# Patient Record
Sex: Male | Born: 1985 | Hispanic: Refuse to answer
Health system: Southern US, Community
[De-identification: ages and names within clinical notes are randomized; demographics above are authoritative.]

## PROBLEM LIST (undated history)

## (undated) DIAGNOSIS — J45909 Unspecified asthma, uncomplicated: Secondary | ICD-10-CM

## (undated) DIAGNOSIS — I10 Essential (primary) hypertension: Secondary | ICD-10-CM

## (undated) DIAGNOSIS — Z789 Other specified health status: Secondary | ICD-10-CM

## (undated) HISTORY — PX: HERNIA REPAIR: SHX51

---
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
## 2006-05-19 ENCOUNTER — Emergency Department (HOSPITAL_COMMUNITY): Admission: EM | Admit: 2006-05-19 | Discharge: 2006-05-20 | Payer: Self-pay | Admitting: Emergency Medicine

## 2006-06-13 ENCOUNTER — Emergency Department (HOSPITAL_COMMUNITY): Admission: EM | Admit: 2006-06-13 | Discharge: 2006-06-13 | Payer: Self-pay | Admitting: Emergency Medicine

## 2007-03-17 ENCOUNTER — Emergency Department (HOSPITAL_COMMUNITY): Admission: EM | Admit: 2007-03-17 | Discharge: 2007-03-17 | Payer: Self-pay | Admitting: Emergency Medicine

## 2007-06-06 ENCOUNTER — Emergency Department (HOSPITAL_COMMUNITY): Admission: EM | Admit: 2007-06-06 | Discharge: 2007-06-07 | Payer: Self-pay | Admitting: Emergency Medicine

## 2008-05-10 ENCOUNTER — Emergency Department (HOSPITAL_COMMUNITY): Admission: EM | Admit: 2008-05-10 | Discharge: 2008-05-10 | Payer: Self-pay | Admitting: Emergency Medicine

## 2008-06-22 ENCOUNTER — Emergency Department (HOSPITAL_COMMUNITY): Admission: EM | Admit: 2008-06-22 | Discharge: 2008-06-22 | Payer: Self-pay | Admitting: Emergency Medicine

## 2012-07-04 ENCOUNTER — Emergency Department (HOSPITAL_COMMUNITY)
Admission: EM | Admit: 2012-07-04 | Discharge: 2012-07-04 | Disposition: A | Discharge status: Home / Self Care | Payer: Self-pay | Attending: Emergency Medicine | Admitting: Emergency Medicine

## 2012-07-04 ENCOUNTER — Encounter (HOSPITAL_COMMUNITY): Payer: Self-pay

## 2012-07-04 DIAGNOSIS — K219 Gastro-esophageal reflux disease without esophagitis: Secondary | ICD-10-CM | POA: Insufficient documentation

## 2012-07-04 DIAGNOSIS — R1110 Vomiting, unspecified: Secondary | ICD-10-CM | POA: Insufficient documentation

## 2012-07-04 DIAGNOSIS — F17200 Nicotine dependence, unspecified, uncomplicated: Secondary | ICD-10-CM | POA: Insufficient documentation

## 2012-07-04 LAB — CBC WITH DIFFERENTIAL/PLATELET
Band Neutrophils: 0 % (ref 0–10)
Basophils Absolute: 0 10*3/uL (ref 0.0–0.1)
Basophils Relative: 0 % (ref 0–1)
HCT: 41.8 % (ref 39.0–52.0)
Hemoglobin: 14.4 g/dL (ref 13.0–17.0)
MCH: 30.3 pg (ref 26.0–34.0)
MCHC: 34.4 g/dL (ref 30.0–36.0)
Metamyelocytes Relative: 0 %
Myelocytes: 0 %
Promyelocytes Absolute: 0 %

## 2012-07-04 LAB — COMPREHENSIVE METABOLIC PANEL
ALT: 29 U/L (ref 0–53)
AST: 18 U/L (ref 0–37)
CO2: 26 mEq/L (ref 19–32)
Calcium: 9 mg/dL (ref 8.4–10.5)
Chloride: 105 mEq/L (ref 96–112)
GFR calc non Af Amer: >90 mL/min (ref >90)
Sodium: 139 mEq/L (ref 135–145)
Total Bilirubin: 0.3 mg/dL (ref 0.3–1.2)

## 2012-07-04 LAB — URINALYSIS, ROUTINE W REFLEX MICROSCOPIC
Bilirubin Urine: NEGATIVE
Glucose, UA: NEGATIVE mg/dL
Ketones, ur: NEGATIVE mg/dL
pH: 6 (ref 5.0–8.0)

## 2012-07-04 MED ORDER — PANTOPRAZOLE SODIUM 40 MG PO TBEC
40.0000 mg | DELAYED_RELEASE_TABLET | Freq: Once | ORAL | Status: AC
  Administered 2012-07-04: 40 mg via ORAL
  Filled 2012-07-04: qty 1

## 2012-07-04 MED ORDER — GI COCKTAIL ~~LOC~~
30.0000 mL | Freq: Once | ORAL | Status: AC
  Administered 2012-07-04: 30 mL via ORAL
  Filled 2012-07-04: qty 30

## 2012-07-04 MED ORDER — ALBUTEROL SULFATE HFA 108 (90 BASE) MCG/ACT IN AERS: 1.0000 | INHALATION_SPRAY | Freq: Four times a day (QID) | RESPIRATORY_TRACT | Status: DC | PRN

## 2012-07-04 MED ORDER — PANTOPRAZOLE SODIUM 20 MG PO TBEC: 20.0000 mg | DELAYED_RELEASE_TABLET | Freq: Every day | ORAL | Status: DC

## 2012-07-04 NOTE — ED Provider Notes (Signed)
History     CSN: [id number]  Arrival date & time 07/04/12  7829   First MD Initiated Contact with Patient 07/04/12 1019      Chief Complaint  Patient presents with  . Chest Pain  . Hematemesis    (Consider location/radiation/quality/duration/timing/severity/associated sxs/prior treatment) HPI Comments: Pt presents to the ED for epigastric abdominal pain and vomiting after a night of drinking with friends.  States he drank several beers and a few liquor drinks last night.  Upon waking this morning, he was having a burning sensation in his epigastrium radiating into his throat with intermittent sharp pains.  States he had one episode of vomiting this morning with what he thinks was blood in it.  Symptoms have now resolved and pt is able to tolerate PO fluids without difficulty.  Pt has never had a formal dx of GERD but suspects that he has it- triggers appears to be spicy foods, acidic fruits and vegetables, and EtOH.  Pt usually drinks on weekends, socially with friends and usually not in excess.  Pt expresses concern for liver damage given events of this am.  Denies any SOB, palpitations, nausea, diarrhea, hematochezia, hematuria, dysuria, or increased urinary frequency.    The history is provided by the patient.    History reviewed. No pertinent past medical history.  History reviewed. No pertinent past surgical history.  No family history on file.  History  Substance Use Topics  . Smoking status: Current Every Day Smoker -- 0.50 packs/day  . Smokeless tobacco: Not on file  . Alcohol Use: Yes     Comment: Regularly       Review of Systems  Gastrointestinal: Positive for nausea and vomiting.       Reflux  All other systems reviewed and are negative.    Allergies  Review of patient's allergies indicates no known allergies.  Home Medications  No current outpatient prescriptions on file.  BP 126/82  Pulse 91  Temp(Src) 97.7 F (36.5 C) (Oral)  Resp 16  Ht 5\' 11"   (1.803 m)  Wt 225 lb (102.059 kg)  BMI 31.39 kg/m2  SpO2 97%  Physical Exam  Nursing note and vitals reviewed. Constitutional: He is oriented to person, place, and time. He appears well-developed and well-nourished.  HENT:  Head: Normocephalic and atraumatic.  Mouth/Throat: Uvula is midline, oropharynx is clear and moist and mucous membranes are normal.  No evidence of blood in oropharynx  Eyes: Conjunctivae and EOM are normal. Pupils are equal, round, and reactive to light.  Neck: Normal range of motion.  Cardiovascular: Normal rate, regular rhythm and normal heart sounds.   Pulmonary/Chest: Effort normal and breath sounds normal.  Abdominal: Soft. Bowel sounds are normal. There is no tenderness. There is no guarding.  Burning sensation epigastrically radiating to sternum  Musculoskeletal: Normal range of motion.  Neurological: He is alert and oriented to person, place, and time.  Skin: Skin is warm and dry.  Psychiatric: He has a normal mood and affect.    ED Course  Procedures (including critical care time)   Date: 07/04/2012  Rate: 89  Rhythm: normal sinus rhythm  QRS Axis: normal  Intervals: normal  ST/T Wave abnormalities: normal  Conduction Disutrbances:none  Narrative Interpretation: normal EKG, no STEMI  Old EKG Reviewed: none available    Labs Reviewed  URINALYSIS, ROUTINE W REFLEX MICROSCOPIC - Abnormal; Notable for the following:    Specific Gravity, Urine 1.033 (*)    All other components within normal limits  CBC  WITH DIFFERENTIAL  COMPREHENSIVE METABOLIC PANEL  BILIRUBIN, DIRECT   No results found.   1. Vomiting   2. Acid reflux       MDM    27 year old male presenting to the ED for sternal chest pain and vomiting after a night of drinking with friends. Patient is concerned that he threw up blood this morning and is concerned for possible liver damage.  Labs largely WNL.  LFT's normal and not consistent with liver disease.  Good relief of sx  with protonix and GI cocktail.  No further vomiting in the ED, low suspicion of esophageal tear or rupture.  Sx not consistent with acute pancreatitis, suspect that sx are GERD related. Instructed to avoid trigger foods and use EtOH in moderation.  Rx protonix.  Referred to [facility] to address other concerns.  Discussed plan with pt- he agreed.  Return precautions advised.     Garlon Hatchet, PA-C 07/06/12 1548  Garlon Hatchet, PA-C 07/06/12 [phone]

## 2012-07-04 NOTE — ED Notes (Signed)
Pt states he woke up around 5am this morning with centralized sharp, burning chest pain. Pain did not radiate. Pain did not last long. States he walked to the bus stop and vomited. Wife states pt vomited at home this morning as well. Pain subsided but patient wanted to get evaluated. Denies any pain or nausea at this time.

## 2012-07-04 NOTE — ED Notes (Addendum)
Pt presents with c/o chest pain that began this morning.  Pt says the pain was in the center of his chest and was a burning pain. Pt is a smoker. Pt also had one episode of vomiting blood this morning. No chest pain at present, has resolved since this morning.

## 2012-07-04 NOTE — Progress Notes (Signed)
[facility] CL gave patient an OC application. °

## 2012-07-19 NOTE — ED Provider Notes (Signed)
History/physical exam/procedure(s) were performed by non-physician practitioner and as supervising physician I was immediately available for consultation/collaboration. I have reviewed all notes and am in agreement with care and plan.   Hilario Quarry, MD 07/19/12 [phone]

## 2012-07-20 NOTE — ED Provider Notes (Signed)
History/physical exam/procedure(s) were performed by non-physician practitioner and as supervising physician I was immediately available for consultation/collaboration. I have reviewed all notes and am in agreement with care and plan.   Hilario Quarry, MD 07/20/12 [phone]

## 2015-08-17 ENCOUNTER — Emergency Department (HOSPITAL_COMMUNITY): Payer: Self-pay

## 2015-08-17 ENCOUNTER — Encounter (HOSPITAL_COMMUNITY): Payer: Self-pay | Admitting: Emergency Medicine

## 2015-08-17 ENCOUNTER — Emergency Department (HOSPITAL_COMMUNITY)
Admission: EM | Admit: 2015-08-17 | Discharge: 2015-08-17 | Disposition: A | Discharge status: Home / Self Care | Payer: Self-pay | Attending: Emergency Medicine | Admitting: Emergency Medicine

## 2015-08-17 DIAGNOSIS — F17210 Nicotine dependence, cigarettes, uncomplicated: Secondary | ICD-10-CM | POA: Insufficient documentation

## 2015-08-17 DIAGNOSIS — Y9367 Activity, basketball: Secondary | ICD-10-CM | POA: Insufficient documentation

## 2015-08-17 DIAGNOSIS — Y999 Unspecified external cause status: Secondary | ICD-10-CM | POA: Insufficient documentation

## 2015-08-17 DIAGNOSIS — R000 Tachycardia, unspecified: Secondary | ICD-10-CM | POA: Insufficient documentation

## 2015-08-17 DIAGNOSIS — S82891A Other fracture of right lower leg, initial encounter for closed fracture: Secondary | ICD-10-CM

## 2015-08-17 DIAGNOSIS — W1849XA Other slipping, tripping and stumbling without falling, initial encounter: Secondary | ICD-10-CM | POA: Insufficient documentation

## 2015-08-17 DIAGNOSIS — S82899A Other fracture of unspecified lower leg, initial encounter for closed fracture: Secondary | ICD-10-CM

## 2015-08-17 DIAGNOSIS — T148XXA Other injury of unspecified body region, initial encounter: Secondary | ICD-10-CM

## 2015-08-17 DIAGNOSIS — Y929 Unspecified place or not applicable: Secondary | ICD-10-CM | POA: Insufficient documentation

## 2015-08-17 DIAGNOSIS — S82831A Other fracture of upper and lower end of right fibula, initial encounter for closed fracture: Secondary | ICD-10-CM | POA: Insufficient documentation

## 2015-08-17 DIAGNOSIS — S9304XA Dislocation of right ankle joint, initial encounter: Secondary | ICD-10-CM | POA: Insufficient documentation

## 2015-08-17 MED ORDER — OXYCODONE-ACETAMINOPHEN 5-325 MG PO TABS
2.0000 | ORAL_TABLET | Freq: Once | ORAL | Status: AC
  Administered 2015-08-17: 2 via ORAL
  Filled 2015-08-17: qty 2

## 2015-08-17 MED ORDER — FENTANYL CITRATE (PF) 100 MCG/2ML IJ SOLN
100.0000 ug | Freq: Once | INTRAMUSCULAR | Status: AC
  Administered 2015-08-17: 100 ug via INTRAVENOUS
  Filled 2015-08-17: qty 2

## 2015-08-17 MED ORDER — HYDROCODONE-ACETAMINOPHEN 5-325 MG PO TABS: 2.0000 | ORAL_TABLET | ORAL | Status: DC | PRN

## 2015-08-17 NOTE — Consult Note (Signed)
Patient ID: George Rollins MRN: [id number] DOB/AGE: 1985/09/09 30 y.o.  Admit date: 08/17/2015  Admission Diagnoses:  Fibula Fracture Subluxation of the Talus Avulsion fragment   HPI: Pleasant 30 year old male pt presents to the ED this evening after severely twisting his ankle playing basketball.  He reports severe pain and he is unable to bear weight.   Past Medical History: History reviewed. No pertinent past medical history.  Surgical History: History reviewed. No pertinent past surgical history.  Family History: History reviewed. No pertinent family history.  Social History: Social History   Social History  . Marital Status: Single    Spouse Name: N/A  . Number of Children: N/A  . Years of Education: N/A   Occupational History  . Not on file.   Social History Main Topics  . Smoking status: Current Every Day Smoker -- 0.50 packs/day  . Smokeless tobacco: Not on file  . Alcohol Use: Yes     Comment: Regularly   . Drug Use: No  . Sexual Activity: Not on file   Other Topics Concern  . Not on file   Social History Narrative    Allergies: Review of patient's allergies indicates no known allergies.  Medications: I have reviewed the patient's current medications.  Vital Signs: Patient Vitals for the past 24 hrs:  BP Temp Temp src Pulse Resp SpO2  08/17/15 2045 120/87 mmHg - - 87 - 95 %  08/17/15 2030 122/87 mmHg - - 89 - 95 %  08/17/15 2015 117/77 mmHg - - 94 - 94 %  08/17/15 2000 122/86 mmHg - - 91 - 98 %  08/17/15 1830 127/79 mmHg - - 98 - 93 %  08/17/15 1815 131/90 mmHg - - 103 - 98 %  08/17/15 1757 135/88 mmHg 99.2 F (37.3 C) Oral 109 16 99 %    Radiology: Dg Ankle Complete Right  08/17/2015  CLINICAL DATA:  Pt reports right ankle injury while playing basketball today; ankle has already been reduced due to an obvious deformity per patient; no prior history of injury or surgery EXAM: RIGHT ANKLE - COMPLETE 3+ VIEW COMPARISON:  Foot film  06/22/2008 FINDINGS: There is an oblique fracture through the distal fibula above the lateral malleolus. There is lateral subluxation of the talus in relation to the tibia (widening of the medial ankle mortise). There is a fragment within the medial ankle mortise concerning for a medial malleolar avulsion fracture. IMPRESSION: 1. Oblique fracture of the distal fibula. 2. Lateral subluxation of the talus with widening of the medial ankle mortise. 3. Avulsion fragment within the widened medial mortise likely donated from the medial malleolus. Electronically Signed   By: Genevive BiStewart  Edmunds M.D.   On: 08/17/2015 19:10    Labs: No results for input(s): WBC, RBC, HCT, PLT in the last 72 hours. No results for input(s): NA, K, CL, CO2, BUN, CREATININE, GLUCOSE, CALCIUM in the last 72 hours. No results for input(s): LABPT, INR in the last 72 hours.  Review of Systems: ROS  Physical Exam: Neurologically intact ABD soft Sensation intact distally Compartment soft Edema and deformity note Able to move toes  Assessment and Plan: Pt reduced in ED Post reduction xray reviewed Pt will go home and present to clinic this week to discuss surgical intervention Pain medication will be provided by the ED Dr.  Venita Lickahari Riva Sesma, MD [facility] [phone]  Post reduction xrays satisfactory Widening of the medial clear space  Minimally displaced lateral malleolar fracture May require  ORIF as out patient  Will discuss with Dr Victorino DikeHewitt and arrange definitive fracture management Ok for d/c from ER if able to crutch walk (strict NWB R LE) and pain controlled Will keep elevated and iced to reduce swelling

## 2015-08-17 NOTE — ED Notes (Signed)
[facility] from scene. Right ankle obvious deformity. Pulses and sensation intact distal to injury. fentanyl PTA

## 2015-08-17 NOTE — Discharge Instructions (Signed)
Call office in the morning to arrange follow up with Dr Victorino DikeHewitt [phone]

## 2015-08-17 NOTE — ED Provider Notes (Signed)
CSN: [id number]     Arrival date & time 08/17/15  1753 History   First MD Initiated Contact with Patient 08/17/15 1755     Chief Complaint  Patient presents with  . Leg Injury     (Consider location/radiation/quality/duration/timing/severity/associated sxs/prior Treatment) HPI  The patient is a 30 year old male, he states that he was playing basketball just prior to arrival when he came down from a jump, slipped in the mud and felt acute onset of pain in his right ankle. He was found to have a deformity of his right ankle and was immobilized in a soft splint by EMS. The patient denies any other injuries and has no numbness. Symptoms are persistent, acute in onset, worse with palpation, not associated with head injury  History reviewed. No pertinent past medical history. History reviewed. No pertinent past surgical history. History reviewed. No pertinent family history. Social History  Substance Use Topics  . Smoking status: Current Every Day Smoker -- 0.50 packs/day  . Smokeless tobacco: None  . Alcohol Use: Yes     Comment: Regularly     Review of Systems  All other systems reviewed and are negative.     Allergies  Review of patient's allergies indicates no known allergies.  Home Medications   Prior to Admission medications   Medication Sig Start Date End Date Taking? Authorizing Provider  albuterol (PROVENTIL HFA;VENTOLIN HFA) 108 (90 BASE) MCG/ACT inhaler Inhale 1-2 puffs into the lungs every 6 (six) hours as needed for wheezing. 07/04/12  Yes Garlon HatchetLisa M Sanders, PA-C  albuterol (PROVENTIL HFA;VENTOLIN HFA) 108 (90 BASE) MCG/ACT inhaler Inhale 2 puffs into the lungs every 6 (six) hours as needed for wheezing.    Historical Provider, MD  HYDROcodone-acetaminophen (NORCO/VICODIN) 5-325 MG tablet Take 2 tablets by mouth every 4 (four) hours as needed. 08/17/15   Eber HongBrian Anastasia Tompson, MD  loratadine-pseudoephedrine (CLARITIN-D 24-HOUR) 10-240 MG per 24 hr tablet Take 1 tablet by mouth daily  as needed for allergies.    Historical Provider, MD  pantoprazole (PROTONIX) 20 MG tablet Take 1 tablet (20 mg total) by mouth daily. 07/04/12   Garlon HatchetLisa M Sanders, PA-C   BP 120/87 mmHg  Pulse 87  Temp(Src) 99.2 F (37.3 C) (Oral)  Resp 16  SpO2 95% Physical Exam  Constitutional: He appears well-developed and well-nourished. No distress.  HENT:  Head: Normocephalic and atraumatic.  Mouth/Throat: Oropharynx is clear and moist. No oropharyngeal exudate.  Eyes: Conjunctivae and EOM are normal. Pupils are equal, round, and reactive to light. Right eye exhibits no discharge. Left eye exhibits no discharge. No scleral icterus.  Neck: Normal range of motion. Neck supple. No JVD present. No thyromegaly present.  Cardiovascular: Regular rhythm, normal heart sounds and intact distal pulses.  Exam reveals no gallop and no friction rub.   No murmur heard. Mild tachycardia. Normal pulses at the right foot  Pulmonary/Chest: Effort normal and breath sounds normal. No respiratory distress. He has no wheezes. He has no rales.  Abdominal: Soft. Bowel sounds are normal. He exhibits no distension and no mass. There is no tenderness.  Musculoskeletal: He exhibits tenderness. He exhibits no edema.  Tenderness and deformity to the right ankle, the foot appears to be laterally deviated  Lymphadenopathy:    He has no cervical adenopathy.  Neurological: He is alert. Coordination normal.  Normal sensation to the right foot  Skin: Skin is warm and dry. No rash noted. No erythema.  Psychiatric: He has a normal mood and affect. His behavior is normal.  Nursing note and vitals reviewed.   ED Course  Reduction of dislocation Date/Time: 08/17/2015 6:10 PM Performed by: Eber HongMILLER, Sierra Bissonette Authorized by: Eber HongMILLER, Lagretta Loseke Consent: Verbal consent obtained. Risks and benefits: risks, benefits and alternatives were discussed Consent given by: patient Patient understanding: patient states understanding of the procedure being  performed Required items: required blood products, implants, devices, and special equipment available Patient identity confirmed: verbally with patient Time out: Immediately prior to procedure a "time out" was called to verify the correct patient, procedure, equipment, support staff and site/side marked as required. Local anesthesia used: no Patient sedated: no Patient tolerance: Patient tolerated the procedure well with no immediate complications Comments: Postreduction the patient had normal pulses sensation but decreased range of motion secondary to pain and mild swelling. He has crepitance over the right lateral ankle, no tenderness over the medial malleolus   (including critical care time) Labs Review Labs Reviewed - No data to display  Imaging Review Dg Ankle Complete Right  08/17/2015  CLINICAL DATA:  Pt reports right ankle injury while playing basketball today; ankle has already been reduced due to an obvious deformity per patient; no prior history of injury or surgery EXAM: RIGHT ANKLE - COMPLETE 3+ VIEW COMPARISON:  Foot film 06/22/2008 FINDINGS: There is an oblique fracture through the distal fibula above the lateral malleolus. There is lateral subluxation of the talus in relation to the tibia (widening of the medial ankle mortise). There is a fragment within the medial ankle mortise concerning for a medial malleolar avulsion fracture. IMPRESSION: 1. Oblique fracture of the distal fibula. 2. Lateral subluxation of the talus with widening of the medial ankle mortise. 3. Avulsion fragment within the widened medial mortise likely donated from the medial malleolus. Electronically Signed   By: Genevive BiStewart  Edmunds M.D.   On: 08/17/2015 19:10   I have personally reviewed and evaluated these images and lab results as part of my medical decision-making.  D/w Dr. Shon BatonBrooks - in agreement with d/c - he has seen the pt - imaging obtained post reduction - splint placed and my eval shows normal n/v status  after reduction.  MDM   Final diagnoses:  Fracture, ankle, right, closed, initial encounter    Clinically the patient has a dislocated ankle, I suspect there is some level of fracture. On manipulation of the ankle with patient did not have much pain so I reduced the fracture immediately before sending the patient x-ray. He tolerated this very well. He will be given fentanyl and an x-ray. He will need immobilization and orthopedic follow-up. The ankle appears to be stable in the ankle mortise after reduction.  Meds given in ED:  Medications  fentaNYL (SUBLIMAZE) injection 100 mcg (100 mcg Intravenous Given 08/17/15 1808)  oxyCODONE-acetaminophen (PERCOCET/ROXICET) 5-325 MG per tablet 2 tablet (2 tablets Oral Given 08/17/15 1948)    New Prescriptions   HYDROCODONE-ACETAMINOPHEN (NORCO/VICODIN) 5-325 MG TABLET    Take 2 tablets by mouth every 4 (four) hours as needed.      Eber HongBrian Royale Lennartz, MD 08/17/15 2115

## 2015-08-17 NOTE — ED Notes (Signed)
Reduced at bedside by Hyacinth MeekerMiller MD

## 2015-08-17 NOTE — Progress Notes (Signed)
Orthopedic Tech Progress Note Patient Details:  George NeighborsOlijawon Rollins 04-20-1985 [id number]  Ortho Devices Type of Ortho Device: Ace wrap, Post (short leg) splint, Stirrup splint Ortho Device/Splint Location: RLE Ortho Device/Splint Interventions: Ordered, Application   Jennye MoccasinHughes, Orlin Kann Craig 08/17/2015, 7:55 PM

## 2015-08-18 ENCOUNTER — Other Ambulatory Visit: Payer: Self-pay | Admitting: Orthopedic Surgery

## 2015-08-20 ENCOUNTER — Encounter (HOSPITAL_BASED_OUTPATIENT_CLINIC_OR_DEPARTMENT_OTHER): Payer: Self-pay | Admitting: *Deleted

## 2015-08-28 ENCOUNTER — Ambulatory Visit (HOSPITAL_BASED_OUTPATIENT_CLINIC_OR_DEPARTMENT_OTHER)
Admission: RE | Admit: 2015-08-28 | Discharge: 2015-08-28 | Disposition: A | Discharge status: Home / Self Care | Payer: Self-pay | Source: Ambulatory Visit | Attending: Orthopedic Surgery | Admitting: Orthopedic Surgery

## 2015-08-28 ENCOUNTER — Ambulatory Visit (HOSPITAL_BASED_OUTPATIENT_CLINIC_OR_DEPARTMENT_OTHER): Payer: Self-pay | Admitting: Certified Registered"

## 2015-08-28 ENCOUNTER — Encounter (HOSPITAL_BASED_OUTPATIENT_CLINIC_OR_DEPARTMENT_OTHER): Payer: Self-pay | Admitting: *Deleted

## 2015-08-28 ENCOUNTER — Encounter (HOSPITAL_BASED_OUTPATIENT_CLINIC_OR_DEPARTMENT_OTHER)
Admission: RE | Disposition: A | Discharge status: Home / Self Care | Payer: Self-pay | Source: Ambulatory Visit | Attending: Orthopedic Surgery

## 2015-08-28 DIAGNOSIS — Z87891 Personal history of nicotine dependence: Secondary | ICD-10-CM | POA: Insufficient documentation

## 2015-08-28 DIAGNOSIS — Z79899 Other long term (current) drug therapy: Secondary | ICD-10-CM | POA: Insufficient documentation

## 2015-08-28 DIAGNOSIS — S82851A Displaced trimalleolar fracture of right lower leg, initial encounter for closed fracture: Secondary | ICD-10-CM | POA: Insufficient documentation

## 2015-08-28 DIAGNOSIS — X58XXXA Exposure to other specified factors, initial encounter: Secondary | ICD-10-CM | POA: Insufficient documentation

## 2015-08-28 HISTORY — DX: Other specified health status: Z78.9

## 2015-08-28 HISTORY — PX: ORIF ANKLE FRACTURE: SHX5408

## 2015-08-28 SURGERY — OPEN REDUCTION INTERNAL FIXATION (ORIF) ANKLE FRACTURE
Anesthesia: General | Site: Ankle | Laterality: Right

## 2015-08-28 MED ORDER — SODIUM CHLORIDE 0.9 % IV SOLN: INTRAVENOUS | Status: DC

## 2015-08-28 MED ORDER — PROMETHAZINE HCL 25 MG/ML IJ SOLN: 6.2500 mg | INTRAMUSCULAR | Status: DC | PRN

## 2015-08-28 MED ORDER — ONDANSETRON HCL 4 MG/2ML IJ SOLN
INTRAMUSCULAR | Status: DC | PRN
  Administered 2015-08-28: 4 mg via INTRAVENOUS

## 2015-08-28 MED ORDER — DEXAMETHASONE SODIUM PHOSPHATE 10 MG/ML IJ SOLN
INTRAMUSCULAR | Status: AC
  Filled 2015-08-28: qty 1

## 2015-08-28 MED ORDER — FENTANYL CITRATE (PF) 100 MCG/2ML IJ SOLN
50.0000 ug | INTRAMUSCULAR | Status: AC | PRN
  Administered 2015-08-28: 100 ug via INTRAVENOUS
  Administered 2015-08-28 (×2): 50 ug via INTRAVENOUS
  Administered 2015-08-28: 100 ug via INTRAVENOUS

## 2015-08-28 MED ORDER — CHLORHEXIDINE GLUCONATE 4 % EX LIQD: 60.0000 mL | Freq: Once | CUTANEOUS | Status: DC

## 2015-08-28 MED ORDER — FENTANYL CITRATE (PF) 100 MCG/2ML IJ SOLN
INTRAMUSCULAR | Status: AC
  Filled 2015-08-28: qty 2

## 2015-08-28 MED ORDER — CEFAZOLIN SODIUM-DEXTROSE 2-4 GM/100ML-% IV SOLN
2.0000 g | INTRAVENOUS | Status: AC
  Administered 2015-08-28: 2 g via INTRAVENOUS

## 2015-08-28 MED ORDER — GLYCOPYRROLATE 0.2 MG/ML IJ SOLN: 0.2000 mg | Freq: Once | INTRAMUSCULAR | Status: DC | PRN

## 2015-08-28 MED ORDER — KETOROLAC TROMETHAMINE 30 MG/ML IJ SOLN: 30.0000 mg | Freq: Once | INTRAMUSCULAR | Status: DC | PRN

## 2015-08-28 MED ORDER — DEXAMETHASONE SODIUM PHOSPHATE 10 MG/ML IJ SOLN
INTRAMUSCULAR | Status: DC | PRN
  Administered 2015-08-28: 10 mg via INTRAVENOUS

## 2015-08-28 MED ORDER — MIDAZOLAM HCL 2 MG/2ML IJ SOLN
1.0000 mg | INTRAMUSCULAR | Status: DC | PRN
Start: 2015-08-28 — End: 2015-08-28
  Administered 2015-08-28: 2 mg via INTRAVENOUS

## 2015-08-28 MED ORDER — PROPOFOL 10 MG/ML IV BOLUS
INTRAVENOUS | Status: DC | PRN
  Administered 2015-08-28: 50 mg via INTRAVENOUS
  Administered 2015-08-28: 200 mg via INTRAVENOUS

## 2015-08-28 MED ORDER — CEFAZOLIN SODIUM-DEXTROSE 2-4 GM/100ML-% IV SOLN
INTRAVENOUS | Status: AC
  Filled 2015-08-28: qty 100

## 2015-08-28 MED ORDER — 0.9 % SODIUM CHLORIDE (POUR BTL) OPTIME
TOPICAL | Status: DC | PRN
  Administered 2015-08-28: 200 mL

## 2015-08-28 MED ORDER — BUPIVACAINE HCL (PF) 0.5 % IJ SOLN
INTRAMUSCULAR | Status: DC | PRN
  Administered 2015-08-28: 30 mL

## 2015-08-28 MED ORDER — SCOPOLAMINE 1 MG/3DAYS TD PT72: 1.0000 | MEDICATED_PATCH | Freq: Once | TRANSDERMAL | Status: DC | PRN

## 2015-08-28 MED ORDER — LIDOCAINE-EPINEPHRINE (PF) 1.5 %-1:200000 IJ SOLN
INTRAMUSCULAR | Status: DC | PRN
  Administered 2015-08-28: 10 mL via PERINEURAL

## 2015-08-28 MED ORDER — MIDAZOLAM HCL 2 MG/2ML IJ SOLN
INTRAMUSCULAR | Status: AC
  Filled 2015-08-28: qty 2

## 2015-08-28 MED ORDER — OXYCODONE HCL 5 MG PO TABS: 5.0000 mg | ORAL_TABLET | ORAL | Status: DC | PRN

## 2015-08-28 MED ORDER — LIDOCAINE 2% (20 MG/ML) 5 ML SYRINGE
INTRAMUSCULAR | Status: AC
  Filled 2015-08-28: qty 5

## 2015-08-28 MED ORDER — LIDOCAINE 2% (20 MG/ML) 5 ML SYRINGE
INTRAMUSCULAR | Status: DC | PRN
  Administered 2015-08-28: 100 mg via INTRAVENOUS

## 2015-08-28 MED ORDER — ASPIRIN EC 81 MG PO TBEC: 81.0000 mg | DELAYED_RELEASE_TABLET | Freq: Two times a day (BID) | ORAL | Status: DC

## 2015-08-28 MED ORDER — ONDANSETRON HCL 4 MG/2ML IJ SOLN
INTRAMUSCULAR | Status: AC
  Filled 2015-08-28: qty 2

## 2015-08-28 MED ORDER — LACTATED RINGERS IV SOLN
INTRAVENOUS | Status: DC
  Administered 2015-08-28: 13:00:00 via INTRAVENOUS
  Administered 2015-08-28: 10 mL/h via INTRAVENOUS

## 2015-08-28 MED ORDER — HYDROMORPHONE HCL 1 MG/ML IJ SOLN
INTRAMUSCULAR | Status: AC
  Filled 2015-08-28: qty 1

## 2015-08-28 MED ORDER — HYDROMORPHONE HCL 1 MG/ML IJ SOLN
0.2500 mg | INTRAMUSCULAR | Status: DC | PRN
  Administered 2015-08-28: 0.5 mg via INTRAVENOUS

## 2015-08-28 MED ORDER — OXYCODONE HCL 5 MG PO TABS
ORAL_TABLET | ORAL | Status: AC
  Filled 2015-08-28: qty 1

## 2015-08-28 MED ORDER — OXYCODONE HCL 5 MG PO TABS
5.0000 mg | ORAL_TABLET | Freq: Once | ORAL | Status: AC | PRN
  Administered 2015-08-28: 5 mg via ORAL

## 2015-08-28 SURGICAL SUPPLY — 72 items
BANDAGE ESMARK 6X9 LF (GAUZE/BANDAGES/DRESSINGS) ×1 IMPLANT
BIT DRILL 2.5X2.75 QC CALB (BIT) ×3 IMPLANT
BIT DRILL 3.5X5.5 QC CALB (BIT) ×3 IMPLANT
BLADE SURG 15 STRL LF DISP TIS (BLADE) ×2 IMPLANT
BLADE SURG 15 STRL SS (BLADE) ×4
BNDG COHESIVE 4X5 TAN STRL (GAUZE/BANDAGES/DRESSINGS) ×3 IMPLANT
BNDG COHESIVE 6X5 TAN STRL LF (GAUZE/BANDAGES/DRESSINGS) ×3 IMPLANT
BNDG ESMARK 6X9 LF (GAUZE/BANDAGES/DRESSINGS) ×3
BOOT STEPPER DURA LG (SOFTGOODS) ×3 IMPLANT
CANISTER SUCT 1200ML W/VALVE (MISCELLANEOUS) ×3 IMPLANT
CHLORAPREP W/TINT 26ML (MISCELLANEOUS) ×3 IMPLANT
COVER BACK TABLE 60X90IN (DRAPES) ×3 IMPLANT
CUFF TOURNIQUET SINGLE 34IN LL (TOURNIQUET CUFF) ×3 IMPLANT
DECANTER SPIKE VIAL GLASS SM (MISCELLANEOUS) IMPLANT
DRAPE EXTREMITY T 121X128X90 (DRAPE) ×3 IMPLANT
DRAPE OEC MINIVIEW 54X84 (DRAPES) ×3 IMPLANT
DRAPE U-SHAPE 47X51 STRL (DRAPES) ×3 IMPLANT
DRSG MEPITEL 4X7.2 (GAUZE/BANDAGES/DRESSINGS) ×3 IMPLANT
DRSG PAD ABDOMINAL 8X10 ST (GAUZE/BANDAGES/DRESSINGS) ×6 IMPLANT
ELECT REM PT RETURN 9FT ADLT (ELECTROSURGICAL) ×3
ELECTRODE REM PT RTRN 9FT ADLT (ELECTROSURGICAL) ×1 IMPLANT
GAUZE SPONGE 4X4 12PLY STRL (GAUZE/BANDAGES/DRESSINGS) ×3 IMPLANT
GLOVE BIO SURGEON STRL SZ8 (GLOVE) ×3 IMPLANT
GLOVE BIOGEL PI IND STRL 7.0 (GLOVE) ×2 IMPLANT
GLOVE BIOGEL PI IND STRL 8 (GLOVE) ×1 IMPLANT
GLOVE BIOGEL PI INDICATOR 7.0 (GLOVE) ×4
GLOVE BIOGEL PI INDICATOR 8 (GLOVE) ×2
GLOVE ECLIPSE 6.5 STRL STRAW (GLOVE) ×3 IMPLANT
GLOVE ECLIPSE 7.5 STRL STRAW (GLOVE) IMPLANT
GLOVE EXAM NITRILE MD LF STRL (GLOVE) IMPLANT
GOWN STRL REUS W/ TWL LRG LVL3 (GOWN DISPOSABLE) ×1 IMPLANT
GOWN STRL REUS W/ TWL XL LVL3 (GOWN DISPOSABLE) ×1 IMPLANT
GOWN STRL REUS W/TWL LRG LVL3 (GOWN DISPOSABLE) ×2
GOWN STRL REUS W/TWL XL LVL3 (GOWN DISPOSABLE) ×2
NEEDLE HYPO 22GX1.5 SAFETY (NEEDLE) IMPLANT
NS IRRIG 1000ML POUR BTL (IV SOLUTION) ×3 IMPLANT
PACK BASIN DAY SURGERY FS (CUSTOM PROCEDURE TRAY) ×3 IMPLANT
PAD CAST 4YDX4 CTTN HI CHSV (CAST SUPPLIES) ×1 IMPLANT
PADDING CAST ABS 4INX4YD NS (CAST SUPPLIES)
PADDING CAST ABS COTTON 4X4 ST (CAST SUPPLIES) IMPLANT
PADDING CAST COTTON 4X4 STRL (CAST SUPPLIES) ×2
PADDING CAST COTTON 6X4 STRL (CAST SUPPLIES) ×3 IMPLANT
PENCIL BUTTON HOLSTER BLD 10FT (ELECTRODE) ×3 IMPLANT
PLATE ACE 100DEG 7HOLE (Plate) ×3 IMPLANT
SANITIZER HAND PURELL 535ML FO (MISCELLANEOUS) ×3 IMPLANT
SCREW CORTICAL 3.5MM  16MM (Screw) ×6 IMPLANT
SCREW CORTICAL 3.5MM 14MM (Screw) ×3 IMPLANT
SCREW CORTICAL 3.5MM 16MM (Screw) ×3 IMPLANT
SCREW CORTICAL 3.5MM 18MM (Screw) ×6 IMPLANT
SCREW CORTICAL 3.5MM 24MM (Screw) ×3 IMPLANT
SHEET MEDIUM DRAPE 40X70 STRL (DRAPES) ×3 IMPLANT
SLEEVE SCD COMPRESS KNEE MED (MISCELLANEOUS) ×3 IMPLANT
SPLINT FAST PLASTER 5X30 (CAST SUPPLIES) ×40
SPLINT PLASTER CAST FAST 5X30 (CAST SUPPLIES) ×20 IMPLANT
SPONGE LAP 18X18 X RAY DECT (DISPOSABLE) ×3 IMPLANT
STOCKINETTE 6  STRL (DRAPES) ×2
STOCKINETTE 6 STRL (DRAPES) ×1 IMPLANT
SUCTION FRAZIER HANDLE 10FR (MISCELLANEOUS) ×2
SUCTION TUBE FRAZIER 10FR DISP (MISCELLANEOUS) ×1 IMPLANT
SUT ETHILON 3 0 PS 1 (SUTURE) ×3 IMPLANT
SUT FIBERWIRE #2 38 T-5 BLUE (SUTURE)
SUT MNCRL AB 3-0 PS2 18 (SUTURE) IMPLANT
SUT VIC AB 0 SH 27 (SUTURE) IMPLANT
SUT VIC AB 2-0 SH 27 (SUTURE) ×4
SUT VIC AB 2-0 SH 27XBRD (SUTURE) ×2 IMPLANT
SUTURE FIBERWR #2 38 T-5 BLUE (SUTURE) IMPLANT
SYR BULB 3OZ (MISCELLANEOUS) ×3 IMPLANT
SYR CONTROL 10ML LL (SYRINGE) IMPLANT
TOWEL OR 17X24 6PK STRL BLUE (TOWEL DISPOSABLE) ×6 IMPLANT
TUBE CONNECTING 20'X1/4 (TUBING) ×1
TUBE CONNECTING 20X1/4 (TUBING) ×2 IMPLANT
UNDERPAD 30X30 (UNDERPADS AND DIAPERS) ×3 IMPLANT

## 2015-08-28 NOTE — Anesthesia Preprocedure Evaluation (Addendum)
Anesthesia Evaluation  Patient identified by MRN, date of birth, ID band Patient awake    Reviewed: Allergy & Precautions, NPO status , Patient's Chart, lab work & pertinent test results  Airway Mallampati: I  TM Distance: >3 FB Neck ROM: Full    Dental no notable dental hx. (+) Teeth Intact   Pulmonary former smoker,  Mild RAD   Pulmonary exam normal breath sounds clear to auscultation       Cardiovascular negative cardio ROS Normal cardiovascular exam Rhythm:Regular Rate:Normal     Neuro/Psych negative neurological ROS  negative psych ROS   GI/Hepatic negative GI ROS, Neg liver ROS,   Endo/Other  negative endocrine ROS  Renal/GU negative Renal ROS  negative genitourinary   Musculoskeletal negative musculoskeletal ROS (+)   Abdominal   Peds negative pediatric ROS (+)  Hematology negative hematology ROS (+)   Anesthesia Other Findings   Reproductive/Obstetrics negative OB ROS                            Anesthesia Physical Anesthesia Plan  ASA: II  Anesthesia Plan: General   Post-op Pain Management: GA combined w/ Regional for post-op pain   Induction: Intravenous  Airway Management Planned: LMA  Additional Equipment:   Intra-op Plan:   Post-operative Plan: Extubation in OR  Informed Consent: I have reviewed the patients History and Physical, chart, labs and discussed the procedure including the risks, benefits and alternatives for the proposed anesthesia with the patient or authorized representative who has indicated his/her understanding and acceptance.   Dental advisory given  Plan Discussed with: CRNA and Surgeon  Anesthesia Plan Comments:         Anesthesia Quick Evaluation

## 2015-08-28 NOTE — Anesthesia Procedure Notes (Addendum)
Anesthesia Regional Block:  Popliteal block  Pre-Anesthetic Checklist: ,, timeout performed, Correct Patient, Correct Site, Correct Laterality, Correct Procedure, Correct Position, site marked, Risks and benefits discussed,  Surgical consent,  Pre-op evaluation,  At surgeon's request and post-op pain management  Laterality: Right  Prep: chloraprep       Needles:  Injection technique: Single-shot  Needle Type: Echogenic Stimulator Needle     Needle Length: 9cm 9 cm Needle Gauge: 21 and 21 G    Additional Needles:  Procedures: ultrasound guided (picture in chart) Popliteal block Narrative:  Injection made incrementally with aspirations every 5 mL.  Performed by: Personally  Anesthesiologist: ROSE, Greggory StallionGEORGE  Additional Notes: Patient tolerated the procedure well without complications   Procedure Name: LMA Insertion Date/Time: 08/28/2015 2:28 PM Performed by: Gar GibbonKEETON, Nafisa Olds S Pre-anesthesia Checklist: Patient identified, Emergency Drugs available, Suction available and Patient being monitored Patient Re-evaluated:Patient Re-evaluated prior to inductionOxygen Delivery Method: Circle system utilized Preoxygenation: Pre-oxygenation with 100% oxygen Intubation Type: IV induction Ventilation: Mask ventilation without difficulty LMA: LMA inserted LMA Size: 5.0 Number of attempts: 1 Airway Equipment and Method: Bite block Placement Confirmation: positive ETCO2 Tube secured with: Tape Dental Injury: Teeth and Oropharynx as per pre-operative assessment

## 2015-08-28 NOTE — Discharge Instructions (Addendum)
George ArthursJohn Hewitt, MD [facility]  Please read the following information regarding your care after surgery.  Medications  You only need a prescription for the narcotic pain medicine (ex. oxycodone, Percocet, Norco).  All of the other medicines listed below are available over the counter. X acetominophen (Tylenol) 650 mg every 4-6 hours as you need for minor pain X oxycodone as prescribed for moderate to severe pain X Ibuprofen (Motrin, Advil) 800 mg every 6 hours for three days   Narcotic pain medicine (ex. oxycodone, Percocet, Vicodin) will cause constipation.  To prevent this problem, take the following medicines while you are taking any pain medicine. X docusate sodium (Colace) 100 mg twice a day X senna (Senokot) 2 tablets twice a day  X To help prevent blood clots, take a baby aspirin (81 mg) twice a day for two weeks after surgery.  You should also get up every hour while you are awake to move around.    Weight Bearing ? Bear weight when you are able on your operated leg or foot. ? Bear weight only on the heel of your operated foot in the post-op shoe. X Do not bear any weight on the operated leg or foot.  Cast / Splint / Dressing X Keep your splint or cast clean and dry.  Dont put anything (coat hanger, pencil, etc) down inside of it.  If it gets damp, use a hair dryer on the cool setting to dry it.  If it gets soaked, call the office to schedule an appointment for a cast change. ? Remove your dressing 3 days after surgery and cover the incisions with dry dressings.    After your dressing, cast or splint is removed; you may shower, but do not soak or scrub the wound.  Allow the water to run over it, and then gently pat it dry.  Swelling It is normal for you to have swelling where you had surgery.  To reduce swelling and pain, keep your toes above your nose for at least 3 days after surgery.  It may be necessary to keep your foot or leg elevated for several weeks.  If it  hurts, it should be elevated.  Follow Up Call my office at [phone] when you are discharged from the hospital or surgery center to schedule an appointment to be seen two weeks after surgery.  Call my office at [phone] if you develop a fever >101.5 F, nausea, vomiting, bleeding from the surgical site or severe pain.     Regional Anesthesia Blocks  1. Numbness or the inability to move the "blocked" extremity may last from 3-48 hours after placement. The length of time depends on the medication injected and your individual response to the medication. If the numbness is not going away after 48 hours, call your surgeon.  2. The extremity that is blocked will need to be protected until the numbness is gone and the  Strength has returned. Because you cannot feel it, you will need to take extra care to avoid injury. Because it may be weak, you may have difficulty moving it or using it. You may not know what position it is in without looking at it while the block is in effect.  3. For blocks in the legs and feet, returning to weight bearing and walking needs to be done carefully. You will need to wait until the numbness is entirely gone and the strength has returned. You should be able to move your leg and foot normally before you  try and bear weight or walk. You will need someone to be with you when you first try to ensure you do not fall and possibly risk injury.  4. Bruising and tenderness at the needle site are common side effects and will resolve in a few days.  5. Persistent numbness or new problems with movement should be communicated to the surgeon or the [facility] [phone])/ [facility] [phone]).      Post Anesthesia Home Care Instructions  Activity: Get plenty of rest for the remainder of the day. A responsible adult should stay with you for 24 hours following the procedure.  For the next 24 hours, DO NOT: -Drive a car -Social workerperate  machinery -Drink alcoholic beverages -Take any medication unless instructed by your physician -Make any legal decisions or sign important papers.  Meals: Start with liquid foods such as gelatin or soup. Progress to regular foods as tolerated. Avoid greasy, spicy, heavy foods. If nausea and/or vomiting occur, drink only clear liquids until the nausea and/or vomiting subsides. Call your physician if vomiting continues.  Special Instructions/Symptoms: Your throat may feel dry or sore from the anesthesia or the breathing tube placed in your throat during surgery. If this causes discomfort, gargle with warm salt water. The discomfort should disappear within 24 hours.  If you had a scopolamine patch placed behind your ear for the management of post- operative nausea and/or vomiting:  1. The medication in the patch is effective for 72 hours, after which it should be removed.  Wrap patch in a tissue and discard in the trash. Wash hands thoroughly with soap and water. 2. You may remove the patch earlier than 72 hours if you experience unpleasant side effects which may include dry mouth, dizziness or visual disturbances. 3. Avoid touching the patch. Wash your hands with soap and water after contact with the patch.

## 2015-08-28 NOTE — Anesthesia Postprocedure Evaluation (Signed)
Anesthesia Post Note  Patient: George Rollins  Procedure(s) Performed: Procedure(s) (LRB): OPEN REDUCTION INTERNAL FIXATION (ORIF)RIGHT  ANKLE LATERAL MALLEOUS FRACTURE  (Right)  Patient location during evaluation: PACU Anesthesia Type: General and Regional Level of consciousness: awake and alert Pain management: pain level controlled Vital Signs Assessment: post-procedure vital signs reviewed and stable Respiratory status: spontaneous breathing, nonlabored ventilation, respiratory function stable and patient connected to nasal cannula oxygen Cardiovascular status: blood pressure returned to baseline and stable Postop Assessment: no signs of nausea or vomiting Anesthetic complications: no    Last Vitals:  Filed Vitals:   08/28/15 1530 08/28/15 1545  BP: 130/88 128/80  Pulse: 76 71  Temp:    Resp: 13 13    Last Pain:  Filed Vitals:   08/28/15 1546  PainSc: 7                  Nilani Hugill S

## 2015-08-28 NOTE — Progress Notes (Signed)
Assisted Dr. Rose with right, ultrasound guided, popliteal/saphenous block. Side rails up, monitors on throughout procedure. See vital signs in flow sheet. Tolerated Procedure well.  

## 2015-08-28 NOTE — Transfer of Care (Signed)
Immediate Anesthesia Transfer of Care Note  Patient: George Rollins  Procedure(s) Performed: Procedure(s): OPEN REDUCTION INTERNAL FIXATION (ORIF)RIGHT  ANKLE LATERAL MALLEOUS FRACTURE  (Right)  Patient Location: PACU  Anesthesia Type:GA combined with regional for post-op pain  Level of Consciousness: awake, sedated and patient cooperative  Airway & Oxygen Therapy: Patient Spontanous Breathing and Patient connected to face mask oxygen  Post-op Assessment: Report given to RN and Post -op Vital signs reviewed and stable  Post vital signs: Reviewed and stable  Last Vitals:  Filed Vitals:   08/28/15 1351 08/28/15 1352  BP:    Pulse: 80 82  Temp:    Resp: 16 16    Last Pain:  Filed Vitals:   08/28/15 1352  PainSc: 0-No pain      Patients Stated Pain Goal: 0 (08/28/15 1303)  Complications: No apparent anesthesia complications

## 2015-08-28 NOTE — Brief Op Note (Signed)
08/28/2015  3:16 PM  PATIENT:  George Rollins  30 y.o. male  PRE-OPERATIVE DIAGNOSIS:  RIGHT ANKLE LATERAL MALLEOUS FRACTURE  POST-OPERATIVE DIAGNOSIS:  Right ankle trimalleolar fracture  Procedure(s): 1.  ORIF right ankle trimal fracture without fixation of the posterior lip 2.  Stress exam of right ankle under fluoro 3.  AP, mortise and lateral xrays of the right ankle  SURGEON:  Toni ArthursJohn Debby Clyne, MD  ASSISTANT: n/a  ANESTHESIA:   General, regional  EBL:  minimal   TOURNIQUET:   Total Tourniquet Time Documented: Thigh (Right) - 23 minutes Total: Thigh (Right) - 23 minutes  COMPLICATIONS:  None apparent  DISPOSITION:  Extubated, awake and stable to recovery.  DICTATION ID:  [id number]

## 2015-08-28 NOTE — H&P (Signed)
George Rollins is an 30 y.o. male.   Chief Complaint: right ankle injury HPI: 30 y/o male without significant PMH injured his right ankle last week.  Radiographs reveal a displaced lateral malleolus fracture.  He presents now for ORIF and possible exploration of the ankle joint to remove loose bodies.  Past Medical History  Diagnosis Date  . Medical history non-contributory     Past Surgical History  Procedure Laterality Date  . Hernia repair      History reviewed. No pertinent family history. Social History:  reports that he has quit smoking. He does not have any smokeless tobacco history on file. He reports that he drinks alcohol. He reports that he does not use illicit drugs.  Allergies: No Known Allergies  Medications Prior to Admission  Medication Sig Dispense Refill  . albuterol (PROVENTIL HFA;VENTOLIN HFA) 108 (90 BASE) MCG/ACT inhaler Inhale 2 puffs into the lungs every 6 (six) hours as needed for wheezing.    Marland Kitchen. HYDROcodone-acetaminophen (NORCO/VICODIN) 5-325 MG tablet Take 2 tablets by mouth every 4 (four) hours as needed. 30 tablet 0  . loratadine-pseudoephedrine (CLARITIN-D 24-HOUR) 10-240 MG per 24 hr tablet Take 1 tablet by mouth daily as needed for allergies.      No results found for this or any previous visit (from the past 48 hour(s)). No results found.  ROS  No recent f/c/n/v/wt loss  Blood pressure 137/77, pulse 82, temperature 98.9 F (37.2 C), temperature source Oral, resp. rate 16, height 5\' 11"  (1.803 m), weight 102.059 kg (225 lb), SpO2 100 %. Physical Exam  wn wd male in nad.  A and O x 4.  Mood and affect normal.  EOMI.  Resp unlabored.  R ankle with healthy skin.  No lymphadenopathy.  Sens to LT intact at the dorsal and plantar foot.  5/5 strength in pf and df of the toes.  Assessment/Plan R ankle lateral malleolus fracture - to OR for surgical treatment.  The risks and benefits of the alternative treatment options have been discussed in detail.  The  patient wishes to proceed with surgery and specifically understands risks of bleeding, infection, nerve damage, blood clots, need for additional surgery, amputation and death.   Toni ArthursHEWITT, Aissatou Fronczak, MD 08/28/2015, 2:01 PM

## 2015-08-29 NOTE — Op Note (Signed)
[facility]:  Megill, Jabori             ACCOUNT NO.:  [id number]  MEDICAL RECORD NO.:  [id number]  LOCATION:                               FACILITY:  [facility]  PHYSICIAN:  Toni ArthursJohn Smera Guyette, MD        DATE OF BIRTH:  1985-05-31  DATE OF PROCEDURE:  08/28/2015 DATE OF DISCHARGE:                              OPERATIVE REPORT   PREOPERATIVE DIAGNOSIS:  Right ankle lateral malleolus fracture.  POSTOPERATIVE DIAGNOSIS:  Right ankle trimalleolar fracture.  PROCEDURE: 1. Open reduction and internal fixation of right ankle trimalleolar     fracture without fixation of the posterior lip. 2. Stress examination of the right ankle under fluoroscopy. 3. AP, mortise, and lateral radiographs of the right ankle.  SURGEON:  Toni ArthursJohn Indyah Saulnier, MD.  ANESTHESIA:  General, regional.  ESTIMATED BLOOD LOSS:  Minimal.  TOURNIQUET TIME:  23 minutes at 250 mmHg.  COMPLICATIONS:  None apparent.  DISPOSITION:  Extubated, awake, and stable to recovery.  INDICATION FOR PROCEDURE:  The patient is a 30 year old male without significant past medical history.  He injured his ankle last week. Radiographs show a lateral malleolus fracture and a small fragment adjacent to the medial gutter.  He presents now for operative treatment of this unstable and displaced ankle fracture.  He understands the risks and benefits, the alternative treatment options, and elects surgical treatment.  He specifically understands the risks of bleeding, infection, nerve damage, blood clots, need for additional surgery, continued pain, nonunion, posttraumatic arthritis, amputation, and death.  PROCEDURE IN DETAIL:  After preoperative consent was obtained and the correct operative site was identified, the patient was brought to the operating room and placed supine on the operating table.  General anesthesia was induced.  Preoperative antibiotics were administered. Surgical time-out was taken.  The right lower extremity was prepped and draped in  standard sterile fashion with a tourniquet around the thigh. The extremity was exsanguinated and the tourniquet was inflated to 250 mmHg.  A longitudinal incision was then made over the lateral malleolus. Sharp dissection was carried down through skin and subcutaneous tissue. The fracture site was identified.  It was cleaned of all hematoma and irrigated copiously.  The fracture was reduced and held with a tenaculum.  A 3.5-mm fully-threaded lag screw was inserted from posterior to anterior across the fracture site.  A 7-hole one-third tubular plate from the [employer] small frag set was then contoured to fit the lateral malleolus.  It was secured distally with 3 unicortical screws and proximally with 3 bicortical screws.  AP, mortise, and lateral radiographs confirmed appropriate reduction of the lateral malleolus fracture.  The posterior malleolus was noted to have a small fracture and the medial malleolus was noted to have a small fracture as well.  These were avulsion fractures that were stable to stress testing.  A stress exam was performed under live fluoroscopy.  A mortise view was obtained and dorsiflexion and external rotation stress was applied to the supinated forefoot.  There was no widening of the ankle mortise or syndesmosis.  The posterior and medial malleolus fractures were stable as well.  Lateral wound was then irrigated copiously.  The deep and superficial subcutaneous tissues were approximated  with inverted simple sutures of 2-0 Vicryl and running 3 nylon was used to close the skin incision.  Sterile dressings were applied followed by well-padded short- leg splint.  Tourniquet was released after application of the dressings at 23 minutes.  The patient was awakened from anesthesia and transported to the recovery room in stable condition.  FOLLOWUP PLAN:  The patient will be nonweightbearing on the right lower extremity.  He will follow up with me in the office in 2 weeks  for suture removal and conversion to a CAM walker boot.  He will take aspirin for DVT prophylaxis.  RADIOGRAPHS:  AP, mortise, and lateral radiographs of the right ankle were obtained intraoperatively.  These show interval reduction and fixation of the lateral malleolus fracture.  The posterior and medial malleolus fracture fragments appear stable and appropriately reduced. No other acute injuries are noted.     Malley Hauter, MD   ______________________________ Violia Knopf, MD    JH/MEDQ  D:  08/28/2015  T:  08/29/2015  Job:  [id number] 

## 2015-08-29 NOTE — Op Note (Deleted)
[facility]:  Megill, Jabori             ACCOUNT NO.:  [id number]  MEDICAL RECORD NO.:  [id number]  LOCATION:                               FACILITY:  [facility]  PHYSICIAN:  Toni ArthursJohn Eleisha Branscomb, MD        DATE OF BIRTH:  1985-05-31  DATE OF PROCEDURE:  08/28/2015 DATE OF DISCHARGE:                              OPERATIVE REPORT   PREOPERATIVE DIAGNOSIS:  Right ankle lateral malleolus fracture.  POSTOPERATIVE DIAGNOSIS:  Right ankle trimalleolar fracture.  PROCEDURE: 1. Open reduction and internal fixation of right ankle trimalleolar     fracture without fixation of the posterior lip. 2. Stress examination of the right ankle under fluoroscopy. 3. AP, mortise, and lateral radiographs of the right ankle.  SURGEON:  Toni ArthursJohn Kupono Marling, MD.  ANESTHESIA:  General, regional.  ESTIMATED BLOOD LOSS:  Minimal.  TOURNIQUET TIME:  23 minutes at 250 mmHg.  COMPLICATIONS:  None apparent.  DISPOSITION:  Extubated, awake, and stable to recovery.  INDICATION FOR PROCEDURE:  The patient is a 30 year old male without significant past medical history.  He injured his ankle last week. Radiographs show a lateral malleolus fracture and a small fragment adjacent to the medial gutter.  He presents now for operative treatment of this unstable and displaced ankle fracture.  He understands the risks and benefits, the alternative treatment options, and elects surgical treatment.  He specifically understands the risks of bleeding, infection, nerve damage, blood clots, need for additional surgery, continued pain, nonunion, posttraumatic arthritis, amputation, and death.  PROCEDURE IN DETAIL:  After preoperative consent was obtained and the correct operative site was identified, the patient was brought to the operating room and placed supine on the operating table.  General anesthesia was induced.  Preoperative antibiotics were administered. Surgical time-out was taken.  The right lower extremity was prepped and draped in  standard sterile fashion with a tourniquet around the thigh. The extremity was exsanguinated and the tourniquet was inflated to 250 mmHg.  A longitudinal incision was then made over the lateral malleolus. Sharp dissection was carried down through skin and subcutaneous tissue. The fracture site was identified.  It was cleaned of all hematoma and irrigated copiously.  The fracture was reduced and held with a tenaculum.  A 3.5-mm fully-threaded lag screw was inserted from posterior to anterior across the fracture site.  A 7-hole one-third tubular plate from the [employer] small frag set was then contoured to fit the lateral malleolus.  It was secured distally with 3 unicortical screws and proximally with 3 bicortical screws.  AP, mortise, and lateral radiographs confirmed appropriate reduction of the lateral malleolus fracture.  The posterior malleolus was noted to have a small fracture and the medial malleolus was noted to have a small fracture as well.  These were avulsion fractures that were stable to stress testing.  A stress exam was performed under live fluoroscopy.  A mortise view was obtained and dorsiflexion and external rotation stress was applied to the supinated forefoot.  There was no widening of the ankle mortise or syndesmosis.  The posterior and medial malleolus fractures were stable as well.  Lateral wound was then irrigated copiously.  The deep and superficial subcutaneous tissues were approximated  with inverted simple sutures of 2-0 Vicryl and running 3 nylon was used to close the skin incision.  Sterile dressings were applied followed by well-padded short- leg splint.  Tourniquet was released after application of the dressings at 23 minutes.  The patient was awakened from anesthesia and transported to the recovery room in stable condition.  FOLLOWUP PLAN:  The patient will be nonweightbearing on the right lower extremity.  He will follow up with me in the office in 2 weeks  for suture removal and conversion to a CAM walker boot.  He will take aspirin for DVT prophylaxis.  RADIOGRAPHS:  AP, mortise, and lateral radiographs of the right ankle were obtained intraoperatively.  These show interval reduction and fixation of the lateral malleolus fracture.  The posterior and medial malleolus fracture fragments appear stable and appropriately reduced. No other acute injuries are noted.     Toni ArthursJohn Mayreli Alden, MD   ______________________________ Toni ArthursJohn Domingo Fuson, MD    JH/MEDQ  D:  08/28/2015  T:  08/29/2015  Job:  [id number]

## 2015-09-02 NOTE — Addendum Note (Signed)
Addendum  created 09/02/15 0954 by Lance CoonWesley Lorina Duffner, CRNA   Modules edited: Anesthesia Responsible Staff

## 2015-09-02 NOTE — Addendum Note (Signed)
Addendum  created 09/02/15 0947 by Lance CoonWesley Kanon Colunga, CRNA   Modules edited: Anesthesia Responsible Staff

## 2015-09-02 NOTE — Addendum Note (Signed)
Addendum  created 09/02/15 0944 by Burna CashJoseph C Marta Bouie, CRNA   Modules edited: Anesthesia Responsible Staff

## 2015-09-03 ENCOUNTER — Encounter (HOSPITAL_BASED_OUTPATIENT_CLINIC_OR_DEPARTMENT_OTHER): Payer: Self-pay | Admitting: Orthopedic Surgery

## 2017-04-15 ENCOUNTER — Ambulatory Visit (HOSPITAL_COMMUNITY)
Admission: EM | Admit: 2017-04-15 | Discharge: 2017-04-15 | Disposition: A | Discharge status: Home / Self Care | Payer: BLUE CROSS/BLUE SHIELD | Attending: Family Medicine | Admitting: Family Medicine

## 2017-04-15 ENCOUNTER — Encounter (HOSPITAL_COMMUNITY): Payer: Self-pay | Admitting: Emergency Medicine

## 2017-04-15 ENCOUNTER — Other Ambulatory Visit: Payer: Self-pay

## 2017-04-15 DIAGNOSIS — Z113 Encounter for screening for infections with a predominantly sexual mode of transmission: Secondary | ICD-10-CM | POA: Diagnosis not present

## 2017-04-15 DIAGNOSIS — Z87891 Personal history of nicotine dependence: Secondary | ICD-10-CM | POA: Diagnosis not present

## 2017-04-15 DIAGNOSIS — Z202 Contact with and (suspected) exposure to infections with a predominantly sexual mode of transmission: Secondary | ICD-10-CM | POA: Diagnosis not present

## 2017-04-15 MED ORDER — CEFTRIAXONE SODIUM 250 MG IJ SOLR
250.0000 mg | Freq: Once | INTRAMUSCULAR | Status: AC
  Administered 2017-04-15: 250 mg via INTRAMUSCULAR

## 2017-04-15 MED ORDER — CEFTRIAXONE SODIUM 250 MG IJ SOLR
INTRAMUSCULAR | Status: AC
  Filled 2017-04-15: qty 250

## 2017-04-15 MED ORDER — STERILE WATER FOR INJECTION IJ SOLN
INTRAMUSCULAR | Status: AC
  Filled 2017-04-15: qty 10

## 2017-04-15 MED ORDER — AZITHROMYCIN 250 MG PO TABS
1000.0000 mg | ORAL_TABLET | Freq: Once | ORAL | Status: AC
  Administered 2017-04-15: 1000 mg via ORAL

## 2017-04-15 MED ORDER — AZITHROMYCIN 250 MG PO TABS
ORAL_TABLET | ORAL | Status: AC
  Filled 2017-04-15: qty 4

## 2017-04-15 NOTE — ED Triage Notes (Signed)
Pt states he got a call saying someone he was with was pos for gonorrhea. Denies symptoms.

## 2017-04-15 NOTE — ED Provider Notes (Signed)
[facility]   [id number] 04/15/17 Arrival Time: 1027   SUBJECTIVE:  George NeighborsOlijawon Rollins is a 32 y.o. male who presents to the urgent care with complaint of contact with someone who tested positive for gonorrhea.  No symptoms.   Past Medical History:  Diagnosis Date  . Medical history non-contributory    No family history on file. Social History   Socioeconomic History  . Marital status: Single    Spouse name: Not on file  . Number of children: Not on file  . Years of education: Not on file  . Highest education level: Not on file  Social Needs  . Financial resource strain: Not on file  . Food insecurity - worry: Not on file  . Food insecurity - inability: Not on file  . Transportation needs - medical: Not on file  . Transportation needs - non-medical: Not on file  Occupational History  . Not on file  Tobacco Use  . Smoking status: Former Smoker    Packs/day: 0.00  Substance and Sexual Activity  . Alcohol use: Yes    Comment: Regularly   . Drug use: No  . Sexual activity: Not on file  Other Topics Concern  . Not on file  Social History Narrative  . Not on file   No outpatient medications have been marked as taking for the 04/15/17 encounter [facility](Hospital Encounter).   No Known Allergies    ROS: As per HPI, remainder of ROS negative.   OBJECTIVE:   Vitals:   04/15/17 1120  BP: (!) 153/104  Pulse: 99  Resp: 18  Temp: (!) 97.5 F (36.4 C)  SpO2: 97%     General appearance: alert; no distress Eyes: PERRL; EOMI; conjunctiva normal HENT: normocephalic; atraumatic;  oral mucosa normal Neck: supple Genitalia:  Normal circumcised male Back: no CVA tenderness Extremities: no cyanosis or edema; symmetrical with no gross deformities Skin: warm and dry Neurologic: normal gait; grossly normal Psychological: alert and cooperative; normal mood and affect      Labs:  Results for orders placed or performed during the hospital encounter of 07/04/12    Urinalysis, Routine w reflex microscopic  Result Value Ref Range   Color, Urine YELLOW YELLOW   APPearance CLEAR CLEAR   Specific Gravity, Urine 1.033 (H) 1.005 - 1.030   pH 6.0 5.0 - 8.0   Glucose, UA NEGATIVE NEGATIVE mg/dL   Hgb urine dipstick NEGATIVE NEGATIVE   Bilirubin Urine NEGATIVE NEGATIVE   Ketones, ur NEGATIVE NEGATIVE mg/dL   Protein, ur NEGATIVE NEGATIVE mg/dL   Urobilinogen, UA 0.2 0.0 - 1.0 mg/dL   Nitrite NEGATIVE NEGATIVE   Leukocytes, UA NEGATIVE NEGATIVE  CBC with Differential  Result Value Ref Range   WBC 5.7 4.0 - 10.5 K/uL   RBC 4.75 4.22 - 5.81 MIL/uL   Hemoglobin 14.4 13.0 - 17.0 g/dL   HCT 40.941.8 81.139.0 - 91.452.0 %   MCV 88.0 78.0 - 100.0 fL   MCH 30.3 26.0 - 34.0 pg   MCHC 34.4 30.0 - 36.0 g/dL   RDW 78.213.3 95.611.5 - 21.315.5 %   Platelets 224 150 - 400 K/uL   Neutrophils Relative % 51 43 - 77 %   Lymphocytes Relative 39 12 - 46 %   Monocytes Relative 6 3 - 12 %   Eosinophils Relative 4 0 - 5 %   Basophils Relative 0 0 - 1 %   Band Neutrophils 0 0 - 10 %   Metamyelocytes Relative 0 %   Myelocytes  0 %   Promyelocytes Absolute 0 %   Blasts 0 %   nRBC 0 0 /100 WBC   Neutro Abs 3.0 1.7 - 7.7 K/uL   Lymphs Abs 2.2 0.7 - 4.0 K/uL   Monocytes Absolute 0.3 0.1 - 1.0 K/uL   Eosinophils Absolute 0.2 0.0 - 0.7 K/uL   Basophils Absolute 0.0 0.0 - 0.1 K/uL  Comprehensive metabolic panel  Result Value Ref Range   Sodium 139 135 - 145 mEq/L   Potassium 3.8 3.5 - 5.1 mEq/L   Chloride 105 96 - 112 mEq/L   CO2 26 19 - 32 mEq/L   Glucose, Bld 91 70 - 99 mg/dL   BUN 13 6 - 23 mg/dL   Creatinine, Ser 1.61 0.50 - 1.35 mg/dL   Calcium 9.0 8.4 - 09.6 mg/dL   Total Protein 7.1 6.0 - 8.3 g/dL   Albumin 3.8 3.5 - 5.2 g/dL   AST 18 0 - 37 U/L   ALT 29 0 - 53 U/L   Alkaline Phosphatase 71 39 - 117 U/L   Total Bilirubin 0.3 0.3 - 1.2 mg/dL   GFR calc non Af Amer >90 >90 mL/min   GFR calc Af Amer >90 >90 mL/min  Bilirubin, direct  Result Value Ref Range   Bilirubin, Direct  <0.1 0.0 - 0.3 mg/dL    Labs Reviewed  RPR  HIV ANTIBODY (ROUTINE TESTING)  URINE CYTOLOGY ANCILLARY ONLY    No results found.     ASSESSMENT & PLAN:  1. STD exposure     Meds ordered this encounter  Medications  . cefTRIAXone (ROCEPHIN) injection 250 mg    Order Specific Question:   Antibiotic Indication:    Answer:   STD  . azithromycin (ZITHROMAX) tablet 1,000 mg    Reviewed expectations re: course of current medical issues. Questions answered. Outlined signs and symptoms indicating need for more acute intervention. Patient verbalized understanding. After Visit Summary given.       Elvina Sidle, MD 04/15/17 1154

## 2017-04-15 NOTE — Discharge Instructions (Signed)
Tests are pending and should be completed in 2 days or less.  You have been treated just in case.

## 2017-04-16 LAB — RPR: RPR Ser Ql: NONREACTIVE

## 2017-04-16 LAB — HIV ANTIBODY (ROUTINE TESTING W REFLEX): HIV Screen 4th Generation wRfx: NONREACTIVE

## 2017-04-18 LAB — URINE CYTOLOGY ANCILLARY ONLY
Chlamydia: NEGATIVE
Neisseria Gonorrhea: NEGATIVE
Trichomonas: NEGATIVE

## 2018-03-24 ENCOUNTER — Ambulatory Visit (HOSPITAL_COMMUNITY)
Admission: EM | Admit: 2018-03-24 | Discharge: 2018-03-24 | Disposition: A | Discharge status: Home / Self Care | Payer: BLUE CROSS/BLUE SHIELD | Attending: Internal Medicine | Admitting: Internal Medicine

## 2018-03-24 ENCOUNTER — Encounter (HOSPITAL_COMMUNITY): Payer: Self-pay | Admitting: Emergency Medicine

## 2018-03-24 ENCOUNTER — Telehealth (HOSPITAL_COMMUNITY): Payer: Self-pay | Admitting: Emergency Medicine

## 2018-03-24 DIAGNOSIS — R030 Elevated blood-pressure reading, without diagnosis of hypertension: Secondary | ICD-10-CM | POA: Insufficient documentation

## 2018-03-24 DIAGNOSIS — R109 Unspecified abdominal pain: Secondary | ICD-10-CM

## 2018-03-24 DIAGNOSIS — A630 Anogenital (venereal) warts: Secondary | ICD-10-CM | POA: Diagnosis present

## 2018-03-24 LAB — COMPREHENSIVE METABOLIC PANEL
ALT: 33 U/L (ref 0–44)
AST: 23 U/L (ref 15–41)
Albumin: 4.4 g/dL (ref 3.5–5.0)
Alkaline Phosphatase: 80 U/L (ref 38–126)
Anion gap: 10 (ref 5–15)
BUN: 10 mg/dL (ref 6–20)
CO2: 26 mmol/L (ref 22–32)
Calcium: 9.4 mg/dL (ref 8.9–10.3)
Chloride: 104 mmol/L (ref 98–111)
Creatinine, Ser: 1 mg/dL (ref 0.61–1.24)
GFR calc Af Amer: >60 mL/min (ref >60)
GFR calc non Af Amer: >60 mL/min (ref >60)
Glucose, Bld: 94 mg/dL (ref 70–99)
Potassium: 3.6 mmol/L (ref 3.5–5.1)
Sodium: 140 mmol/L (ref 135–145)
Total Bilirubin: 0.8 mg/dL (ref 0.3–1.2)
Total Protein: 8.1 g/dL (ref 6.5–8.1)

## 2018-03-24 LAB — CBC
HCT: 46.7 % (ref 39.0–52.0)
Hemoglobin: 15.5 g/dL (ref 13.0–17.0)
MCH: 28.7 pg (ref 26.0–34.0)
MCHC: 33.2 g/dL (ref 30.0–36.0)
MCV: 86.3 fL (ref 80.0–100.0)
Platelets: 310 10*3/uL (ref 150–400)
RBC: 5.41 MIL/uL (ref 4.22–5.81)
RDW: 14.2 % (ref 11.5–15.5)
WBC: 7.3 10*3/uL (ref 4.0–10.5)
nRBC: 0 % (ref 0.0–0.2)

## 2018-03-24 LAB — POCT URINALYSIS DIP (DEVICE)
Bilirubin Urine: NEGATIVE
Glucose, UA: NEGATIVE mg/dL
Hgb urine dipstick: NEGATIVE
Ketones, ur: NEGATIVE mg/dL
Leukocytes, UA: NEGATIVE
Nitrite: NEGATIVE
PH: 6.5 (ref 5.0–8.0)
PROTEIN: NEGATIVE mg/dL
SPECIFIC GRAVITY, URINE: 1.02 (ref 1.005–1.030)
UROBILINOGEN UA: 0.2 mg/dL (ref 0.0–1.0)

## 2018-03-24 LAB — TSH: TSH: 1.301 u[IU]/mL (ref 0.350–4.500)

## 2018-03-24 MED ORDER — METOPROLOL TARTRATE 25 MG PO TABS: 25.0000 mg | ORAL_TABLET | Freq: Two times a day (BID) | ORAL | 1 refills | Status: DC

## 2018-03-24 NOTE — ED Triage Notes (Signed)
[facility] after having a high reading on his blood pressure yesterdays (160s systolic).

## 2018-03-24 NOTE — Telephone Encounter (Signed)
Patient contacted and made aware of all results, all questions answered. Pending std tests

## 2018-03-24 NOTE — ED Triage Notes (Signed)
Pt also states he has been having issues with vomiting after eating, and constant nausea with abdominal pain.  Pt also would like a rash on the top of his foot, and a lesion on his penis.

## 2018-03-25 LAB — RPR: RPR Ser Ql: NONREACTIVE

## 2018-03-25 LAB — HIV ANTIBODY (ROUTINE TESTING W REFLEX): HIV Screen 4th Generation wRfx: NONREACTIVE

## 2018-03-27 LAB — URINE CYTOLOGY ANCILLARY ONLY
Chlamydia: NEGATIVE
Neisseria Gonorrhea: NEGATIVE
Trichomonas: NEGATIVE

## 2018-04-04 ENCOUNTER — Telehealth (HOSPITAL_COMMUNITY): Payer: Self-pay | Admitting: Emergency Medicine

## 2018-04-04 NOTE — Telephone Encounter (Signed)
Patient called and given pathology report. All questions answered.

## 2018-08-24 ENCOUNTER — Ambulatory Visit
Admission: EM | Admit: 2018-08-24 | Discharge: 2018-08-24 | Disposition: A | Discharge status: Home / Self Care | Payer: BC Managed Care – PPO | Attending: Urgent Care | Admitting: Urgent Care

## 2018-08-24 ENCOUNTER — Other Ambulatory Visit: Payer: Self-pay

## 2018-08-24 DIAGNOSIS — Z23 Encounter for immunization: Secondary | ICD-10-CM | POA: Diagnosis not present

## 2018-08-24 DIAGNOSIS — W268XXA Contact with other sharp object(s), not elsewhere classified, initial encounter: Secondary | ICD-10-CM

## 2018-08-24 DIAGNOSIS — S61213A Laceration without foreign body of left middle finger without damage to nail, initial encounter: Secondary | ICD-10-CM | POA: Diagnosis not present

## 2018-08-24 HISTORY — DX: Essential (primary) hypertension: I10

## 2018-08-24 MED ORDER — BACITRACIN ZINC 500 UNIT/GM EX OINT: TOPICAL_OINTMENT | Freq: Once | CUTANEOUS | Status: DC

## 2018-08-24 MED ORDER — TETANUS-DIPHTH-ACELL PERTUSSIS 5-2.5-18.5 LF-MCG/0.5 IM SUSP
0.5000 mL | Freq: Once | INTRAMUSCULAR | Status: AC
  Administered 2018-08-24: 0.5 mL via INTRAMUSCULAR

## 2018-08-24 MED ORDER — SILVER NITRATE-POT NITRATE 75-25 % EX MISC: 1.0000 | Freq: Once | CUTANEOUS | Status: DC

## 2018-08-24 NOTE — Discharge Instructions (Addendum)
Keep wound clean and dry. Apply antibiotic ointment twice daily. Keep covered while at work or school. You can get finger cots at the pharmacy. Monitor for signs and symptoms of infection, which would included increased redness, swelling, streaking, drainage, pain, and the development of a fever.   Honor Loh, MSN, APRN, FNP-C, CEN Advanced Practice Provider [facility] 08/24/2018 1:35 PM

## 2018-08-24 NOTE — ED Triage Notes (Signed)
Pt cut his left middle finger with a straight razor an hour ago while cutting hair. Unsure of his last tdap.

## 2018-08-24 NOTE — ED Provider Notes (Signed)
[place], [place]   Name: George Rollins DOB: 1985-08-10 MRN: [id number] CSN: [id number] PCP: Patient, No Pcp Per  Arrival date and time:  08/24/18 1209  Chief Complaint:  Finger laceration  NOTE: Prior to seeing the patient today, I have reviewed the triage nursing documentation and vital signs. Clinical staff has updated patient's PMH/PSHx, current medication list, and drug allergies/intolerances to ensure comprehensive history available to assist in medical decision making.   History:   HPI: George Rollins is a 33 y.o. male who presents today with complaints of a laceration to the tip of the 3rd digit of his LEFT hand. Patient notes that the injury occurred when he was taking a straight razor from a bag while cutting hair. The blade was described as "old, but clean". The patient is a Ship broker at [employer]. His instructor assisted the patient in cleaning the wound, applying "septic powder', and applying a temporary bandage. Wound bleeding upon arrival to the clinic.   Tetanus vaccination status reviewed as being not up to date.   Past Medical History:  Diagnosis Date   HTN (hypertension)     Past Surgical History:  Procedure Laterality Date   HERNIA REPAIR     ORIF ANKLE FRACTURE Right 08/28/2015   Procedure: OPEN REDUCTION INTERNAL FIXATION (ORIF)RIGHT  ANKLE LATERAL MALLEOUS FRACTURE ;  Surgeon: Wylene Simmer, MD;  Location: [place];  Service: Orthopedics;  Laterality: Right;    Family History  Problem Relation Age of Onset   Cancer Mother     Social History   Tobacco Use   Smoking status: Former Smoker    Packs/day: 0.00   Smokeless tobacco: Never Used  Substance Use Topics   Alcohol use: Yes    Comment: Regularly    Drug use: No    There are no active problems to display for this patient.   Home Medications:    Current Meds  Medication Sig   metoprolol tartrate (LOPRESSOR) 25 MG tablet Take 1 tablet (25 mg total) by  mouth 2 (two) times daily.    Allergies:   Patient has no known allergies.  Review of Systems (ROS): Review of Systems  Constitutional: Negative for chills and fever.  Respiratory: Negative for cough, chest tightness and shortness of breath.   Cardiovascular: Negative for chest pain and palpitations.  Musculoskeletal:       Finger laceration on LEFT hand  Skin: Positive for color change and wound.  Neurological: Negative for syncope, weakness and numbness.     Vital Signs: Today's Vitals   08/24/18 1304 08/24/18 1307  BP: (!) 142/98   Pulse: (!) 104   Resp: 18   Temp: 98.7 F (37.1 C)   TempSrc: Oral   SpO2: 97%   Weight:  251 lb (113.9 kg)  PainSc:  6     Physical Exam: Physical Exam  Constitutional: He is oriented to person, place, and time and well-developed, well-nourished, and in no distress.  HENT:  Head: Normocephalic and atraumatic.  Mouth/Throat: Mucous membranes are normal.  Cardiovascular: Regular rhythm, normal heart sounds and intact distal pulses. Tachycardia present. Exam reveals no gallop and no friction rub.  No murmur heard. Pulmonary/Chest: Effort normal and breath sounds normal. No respiratory distress. He has no wheezes. He has no rales.  Musculoskeletal:     Left hand: He exhibits laceration (small bleeding skin avulsion; no nail involvement). He exhibits normal range of motion, no tenderness, no bony tenderness, normal two-point discrimination, normal capillary refill and  no swelling. Normal sensation noted. Normal strength noted.       Hands:     Comments: Presented with black "septic powder" in place. Extensive soaking required to remove in order for visualization/exploration of wound.   Neurological: He is alert and oriented to person, place, and time. Gait normal. GCS score is 15.  Skin: Skin is warm and dry. No rash noted. No erythema.  Psychiatric: Mood, memory, affect and judgment normal.  Nursing note and vitals reviewed.   Urgent Care  Treatments / Results:   LABS: PLEASE NOTE: all labs that were ordered this encounter are listed, however only abnormal results are displayed. Labs Reviewed - No data to display  EKG: -None  RADIOLOGY: No results found.  PROCEDURES: Laceration Repair Performed by: Verlee MonteGray, Shakeena Kafer E, NP Authorized by: Verlee MonteGray, Barbette Mcglaun E, NP   Consent:    Consent obtained:  Verbal   Consent given by:  Patient   Risks discussed:  Infection, need for additional repair, pain, poor cosmetic result, poor wound healing and nerve damage   Alternatives discussed:  No treatment, delayed treatment and observation Universal protocol:    Procedure explained and questions answered to patient or proxy's satisfaction: yes     Patient identity confirmed:  Verbally with patient Anesthesia (see MAR for exact dosages):    Anesthesia method:  None Laceration details:    Location:  Finger   Finger location:  L long finger   Length (cm):  0.5   Laceration depth: superficial. Repair type:    Repair type:  Simple Exploration:    Hemostasis achieved with:  Cautery and direct pressure (silver nitrate application)   Wound exploration: wound explored through full range of motion and entire depth of wound probed and visualized     Wound extent: no foreign bodies/material noted, no muscle damage noted, no nerve damage noted, no tendon damage noted, no underlying fracture noted and no vascular damage noted     Wound extent comment:  Superficial avulsion Treatment:    Area cleansed with:  Hibiclens, Betadine, saline and soap and water   Amount of cleaning:  Extensive   Irrigation solution:  Sterile saline   Irrigation method:  Syringe   Visualized foreign bodies/material removed: no   Skin repair:    Repair method: avulsion; not amenable to closure. Post-procedure details:    Dressing:  Antibiotic ointment, non-adherent dressing and sterile dressing   Patient tolerance of procedure:  Tolerated well, no immediate  complications    MEDICATIONS RECEIVED THIS VISIT: Medications  Tdap (BOOSTRIX) injection 0.5 mL (0.5 mLs Intramuscular Given 08/24/18 1338)  Silver nitrate applicators applicator x 2 sticks - Given 08/24/18 Bacitracin ointment x 1 packet - Given 08/24/18  PERTINENT CLINICAL COURSE NOTES/UPDATES:   Initial Impression / Assessment and Plan / Urgent Care Course:  Pertinent labs & imaging results that were available during my care of the patient were personally reviewed by me and considered in my medical decision making (see lab/imaging section of note for values and interpretations).  George NeighborsOlijawon Rollins is a 33 y.o. male who [facility] with complaints of finger laceration  Patient overall well appearing and in no acute distress today in clinic. Exam reveals 0.5 cm avulsion injury to the skin overlying the tip of the 3rd digit on the LEFT hand. Bleeding upon arrival. Septic powder (black) applied by patient. Hand soaked extensively and wound cleansed manually in several steps; see procedure note. Hemostasis achieved using chemical cautery (silver nitrate). Wound observed  x 10 minutes; residual oozing noted. Second application of silver nitrate applied. Bacitracin and dressing applied. TDAP updated. Patient instructed on wound care and need to keep area covered while cutting hair; recommended finger cot. May use Tylenol and/or Ibuprofen as needed for pain. Monitor for signs and symptoms of infection, which would included increased redness, swelling, streaking, drainage, pain, and the development of a fever.   Current clinical condition warrants patient being out of work in order to recover from his current injury/illness. He was provided with the appropriate documentation to provide to his place of employment that will allow for him to RTW on 08/26/2018 with no restrictions.   Discussed follow up with primary care physician in 1 week for re-evaluation if needed. I have reviewed the  follow up and strict return precautions for any new or worsening symptoms. Patient is aware of symptoms that would be deemed urgent/emergent, and would thus require further evaluation either here or in the emergency department. At the time of discharge, he verbalized understanding and consent with the discharge plan as it was reviewed with him. All questions were fielded by provider and/or clinic staff prior to patient discharge.    Final Clinical Impressions(s) / Urgent Care Diagnoses:   Final diagnoses:  Laceration of left middle finger without foreign body without damage to nail, initial encounter    New Prescriptions:   Meds ordered this encounter  Medications   Tdap (BOOSTRIX) injection 0.5 mL   DISCONTD: bacitracin ointment   DISCONTD: silver nitrate applicators applicator 1 Stick    Controlled Substance Prescriptions:  [place] Controlled Substance Registry consulted? Not Applicable  Recommended Follow up Care:  Patient encouraged to follow up with the following provider within the specified time frame, or sooner as dictated by the severity of his symptoms. As always, he was instructed that for any urgent/emergent care needs, he should seek care either here or in the emergency department for more immediate evaluation.  Follow-up Information    PCP.   Why: As needed        NOTE: This note was prepared using Scientist, clinical (histocompatibility and immunogenetics)Dragon dictation software along with smaller Lobbyistphrase technology. Despite my best ability to proofread, there is the potential that transcriptional errors may still occur from this process, and are completely unintentional.     Verlee MonteGray, Devlin Mcveigh E, NP 08/25/18 2311

## 2018-08-25 ENCOUNTER — Encounter: Payer: Self-pay | Admitting: Urgent Care

## 2018-09-19 ENCOUNTER — Ambulatory Visit (HOSPITAL_COMMUNITY)
Admission: EM | Admit: 2018-09-19 | Discharge: 2018-09-19 | Disposition: A | Discharge status: Home / Self Care | Payer: BC Managed Care – PPO | Attending: Family Medicine | Admitting: Family Medicine

## 2018-09-19 ENCOUNTER — Other Ambulatory Visit: Payer: Self-pay

## 2018-09-19 ENCOUNTER — Encounter (HOSPITAL_COMMUNITY): Payer: Self-pay | Admitting: Emergency Medicine

## 2018-09-19 DIAGNOSIS — Z87891 Personal history of nicotine dependence: Secondary | ICD-10-CM | POA: Diagnosis not present

## 2018-09-19 DIAGNOSIS — Z20828 Contact with and (suspected) exposure to other viral communicable diseases: Secondary | ICD-10-CM | POA: Insufficient documentation

## 2018-09-19 DIAGNOSIS — R531 Weakness: Secondary | ICD-10-CM | POA: Insufficient documentation

## 2018-09-19 DIAGNOSIS — R05 Cough: Secondary | ICD-10-CM | POA: Insufficient documentation

## 2018-09-19 DIAGNOSIS — R110 Nausea: Secondary | ICD-10-CM | POA: Diagnosis not present

## 2018-09-19 DIAGNOSIS — Z20822 Contact with and (suspected) exposure to covid-19: Secondary | ICD-10-CM

## 2018-09-19 DIAGNOSIS — I10 Essential (primary) hypertension: Secondary | ICD-10-CM | POA: Diagnosis not present

## 2018-09-19 DIAGNOSIS — Z79899 Other long term (current) drug therapy: Secondary | ICD-10-CM | POA: Diagnosis not present

## 2018-09-19 DIAGNOSIS — R5383 Other fatigue: Secondary | ICD-10-CM | POA: Diagnosis not present

## 2018-09-19 MED ORDER — ONDANSETRON 4 MG PO TBDP: 4.0000 mg | ORAL_TABLET | Freq: Three times a day (TID) | ORAL | 0 refills | Status: DC | PRN

## 2018-09-19 MED ORDER — BENZONATATE 100 MG PO CAPS: 100.0000 mg | ORAL_CAPSULE | Freq: Three times a day (TID) | ORAL | 0 refills | Status: DC

## 2018-09-19 NOTE — ED Provider Notes (Signed)
[facility]    CSN: [id number] Arrival date & time: 09/19/18  1202     History   Chief Complaint Chief Complaint  Patient presents with  . Cough  . Weakness    HPI George Rollins is a 33 y.o. male.   Patient is a 33 year old male with past medical history of hypertension.  He presents today with cough for several days.  He woke up this morning feeling fatigued and the cough was more productive.  Symptoms have been constant.  He has been taking Mucinex for his symptoms.  Denies any associated fever, chills, body aches, chest pain, shortness of breath, ear pain or sore throat.  No known COVID exposures.  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  ROS per HPI      Past Medical History:  Diagnosis Date  . HTN (hypertension)     There are no active problems to display for this patient.   Past Surgical History:  Procedure Laterality Date  . HERNIA REPAIR    . ORIF ANKLE FRACTURE Right 08/28/2015   Procedure: OPEN REDUCTION INTERNAL FIXATION (ORIF)RIGHT  ANKLE LATERAL MALLEOUS FRACTURE ;  Surgeon: Wylene Simmer, MD;  Location: [place];  Service: Orthopedics;  Laterality: Right;       Home Medications    Prior to Admission medications   Medication Sig Start Date End Date Taking? Authorizing Provider  albuterol (PROVENTIL HFA;VENTOLIN HFA) 108 (90 BASE) MCG/ACT inhaler Inhale 2 puffs into the lungs every 6 (six) hours as needed for wheezing.    [provider]  benzonatate (TESSALON) 100 MG capsule Take 1 capsule (100 mg total) by mouth every 8 (eight) hours. 09/19/18   Loura Halt A, NP  loratadine-pseudoephedrine (CLARITIN-D 24-HOUR) 10-240 MG per 24 hr tablet Take 1 tablet by mouth daily as needed for allergies.    [provider]  metoprolol tartrate (LOPRESSOR) 25 MG tablet Take 1 tablet (25 mg total) by mouth 2 (two) times daily. 03/24/18   Harrie Foreman, MD  ondansetron (ZOFRAN ODT) 4 MG disintegrating tablet Take 1 tablet  (4 mg total) by mouth every 8 (eight) hours as needed for nausea or vomiting. 09/19/18   Orvan July, NP    Family History Family History  Problem Relation Age of Onset  . Cancer Mother     Social History Social History   Tobacco Use  . Smoking status: Former Smoker    Packs/day: 0.00  . Smokeless tobacco: Never Used  Substance Use Topics  . Alcohol use: Yes    Comment: Regularly   . Drug use: No     Allergies   Fruit & vegetable daily [nutritional supplements]   Review of Systems Review of Systems   Physical Exam Triage Vital Signs ED Triage Vitals  Enc Vitals Group     BP 09/19/18 1244 (!) 139/94     Pulse Rate 09/19/18 1244 100     Resp 09/19/18 1244 18     Temp 09/19/18 1244 98.4 F (36.9 C)     Temp src --      SpO2 09/19/18 1244 97 %     Weight --      Height --      Head Circumference --      Peak Flow --      Pain Score 09/19/18 1245 0     Pain Loc --      Pain Edu? --      Excl. in [place]? --  No data found.  Updated Vital Signs BP (!) 139/94   Pulse 100   Temp 98.4 F (36.9 C)   Resp 18   SpO2 97%   Visual Acuity Right Eye Distance:   Left Eye Distance:   Bilateral Distance:    Right Eye Near:   Left Eye Near:    Bilateral Near:     Physical Exam Vitals signs and nursing note reviewed.  Constitutional:      Appearance: Normal appearance.  HENT:     Head: Normocephalic and atraumatic.     Right Ear: Tympanic membrane and ear canal normal.     Left Ear: Tympanic membrane and ear canal normal.     Nose: Nose normal.     Mouth/Throat:     Pharynx: Oropharynx is clear.  Eyes:     Conjunctiva/sclera: Conjunctivae normal.  Neck:     Musculoskeletal: Normal range of motion.  Cardiovascular:     Rate and Rhythm: Normal rate and regular rhythm.     Pulses: Normal pulses.     Heart sounds: Normal heart sounds.  Pulmonary:     Effort: Pulmonary effort is normal.     Breath sounds: Normal breath sounds.  Musculoskeletal: Normal  range of motion.  Skin:    General: Skin is warm and dry.  Neurological:     Mental Status: He is alert.  Psychiatric:        Mood and Affect: Mood normal.      UC Treatments / Results  Labs (all labs ordered are listed, but only abnormal results are displayed) Labs Reviewed  NOVEL CORONAVIRUS, NAA (HOSPITAL ORDER, SEND-OUT TO REF LAB)    EKG   Radiology No results found.  Procedures Procedures (including critical care time)  Medications Ordered in UC Medications - No data to display  Initial Impression / Assessment and Plan / UC Course  I have reviewed the triage vital signs and the nursing notes.  Pertinent labs & imaging results that were available during my care of the patient were reviewed by me and considered in my medical decision making (see chart for details).     Symptoms consistent with some sort of viral illness.  Could be allergy related. COVID testing done today with results pending Recommended continue the Mucinex, starting antihistamine and Tessalon Perles for cough.  Work note given and precautions given until we get COVID results. Follow up as needed for continued or worsening symptoms  Final Clinical Impressions(s) / UC Diagnoses   Final diagnoses:  Suspected Covid-19 Virus Infection     Discharge Instructions     COVID testing done today We will give you a note was at work and to get your results.  You may return to work if results are negative. Make sure that you are taking precautions.  Sending some Tessalon Perles for cough and Zofran for nausea Recommend taking some over-the-counter allergy medication to include Zyrtec, Claritin or Allegra. Follow up as needed for continued or worsening symptoms     ED Prescriptions    Medication Sig Dispense Auth. Provider   benzonatate (TESSALON) 100 MG capsule Take 1 capsule (100 mg total) by mouth every 8 (eight) hours. 21 capsule Lanique Gonzalo A, NP   ondansetron (ZOFRAN ODT) 4 MG  disintegrating tablet Take 1 tablet (4 mg total) by mouth every 8 (eight) hours as needed for nausea or vomiting. 20 tablet Dahlia ByesBast, Donzel Romack A, NP     Controlled Substance Prescriptions [place] Controlled Substance Registry consulted? Not Applicable  Janace ArisBast, Zeeva Courser A, NP 09/19/18 1439

## 2018-09-19 NOTE — Discharge Instructions (Addendum)
COVID testing done today We will give you a note was at work and to get your results.  You may return to work if results are negative. Make sure that you are taking precautions.  Sending some Tessalon Perles for cough and Zofran for nausea Recommend taking some over-the-counter allergy medication to include Zyrtec, Claritin or Allegra. Follow up as needed for continued or worsening symptoms

## 2018-09-19 NOTE — ED Triage Notes (Signed)
Pt c/o cough for several days, woke up this morning feeling weak and fatigue.

## 2018-09-21 ENCOUNTER — Encounter (HOSPITAL_COMMUNITY): Payer: Self-pay

## 2018-09-21 LAB — NOVEL CORONAVIRUS, NAA (HOSP ORDER, SEND-OUT TO REF LAB; TAT 18-24 HRS): SARS-CoV-2, NAA: NOT DETECTED

## 2018-11-21 ENCOUNTER — Other Ambulatory Visit: Payer: Self-pay

## 2018-11-21 ENCOUNTER — Ambulatory Visit (HOSPITAL_COMMUNITY)
Admission: EM | Admit: 2018-11-21 | Discharge: 2018-11-21 | Disposition: A | Discharge status: Home / Self Care | Payer: Self-pay | Attending: Family Medicine | Admitting: Family Medicine

## 2018-11-21 ENCOUNTER — Encounter (HOSPITAL_COMMUNITY): Payer: Self-pay

## 2018-11-21 ENCOUNTER — Other Ambulatory Visit: Payer: Self-pay | Admitting: Family Medicine

## 2018-11-21 DIAGNOSIS — N6320 Unspecified lump in the left breast, unspecified quadrant: Secondary | ICD-10-CM

## 2018-11-21 DIAGNOSIS — Z113 Encounter for screening for infections with a predominantly sexual mode of transmission: Secondary | ICD-10-CM | POA: Insufficient documentation

## 2018-11-21 DIAGNOSIS — N63 Unspecified lump in unspecified breast: Secondary | ICD-10-CM | POA: Insufficient documentation

## 2018-11-21 MED ORDER — CEPHALEXIN 500 MG PO CAPS: 500.0000 mg | ORAL_CAPSULE | Freq: Four times a day (QID) | ORAL | 0 refills | Status: DC

## 2018-11-21 NOTE — Discharge Instructions (Addendum)
Sending you for an ultrasound of the breast.  Trying a round of antibiotics in case this is some sort of infection  They will be calling you to schedule an appointment If ou have not heard from them by Friday I would give them a call.  Follow up as needed for continued or worsening symptoms

## 2018-11-21 NOTE — ED Triage Notes (Signed)
Pt state he has a lump in his left breast. Pt states he would like to be tested for STDs.

## 2018-11-21 NOTE — ED Provider Notes (Signed)
[facility]    CSN: [id number] Arrival date & time: 11/21/18  [id number]      History   Chief Complaint Chief Complaint  Patient presents with  . knot breast  . SEXUALLY TRANSMITTED DISEASE    HPI George Rollins is a 33 y.o. male.   Patient is a 33 year old male with past medical history of hypertension.  He presents today with palpable mass to left breast under the nipple area.  Reporting he noticed this approximately 3 weeks ago.  He believes it is gotten slightly larger since last week.  Mildly tender to touch.  No nipple discharge.  No fevers or drainage.  He has not done anything to treat symptoms.  Denies any significant family history of breast cancer.  Patient also requesting STD testing.  He denies any symptoms currently.     Past Medical History:  Diagnosis Date  . HTN (hypertension)     There are no active problems to display for this patient.   Past Surgical History:  Procedure Laterality Date  . HERNIA REPAIR    . ORIF ANKLE FRACTURE Right 08/28/2015   Procedure: OPEN REDUCTION INTERNAL FIXATION (ORIF)RIGHT  ANKLE LATERAL MALLEOUS FRACTURE ;  Surgeon: Toni ArthursJohn Hewitt, MD;  Location: [facility];  Service: Orthopedics;  Laterality: Right;       Home Medications    Prior to Admission medications   Medication Sig Start Date End Date Taking? Authorizing Provider  albuterol (PROVENTIL HFA;VENTOLIN HFA) 108 (90 BASE) MCG/ACT inhaler Inhale 2 puffs into the lungs every 6 (six) hours as needed for wheezing.    [provider]  benzonatate (TESSALON) 100 MG capsule Take 1 capsule (100 mg total) by mouth every 8 (eight) hours. 09/19/18   Dahlia ByesBast, Gregery Walberg A, NP  cephALEXin (KEFLEX) 500 MG capsule Take 1 capsule (500 mg total) by mouth 4 (four) times daily. 11/21/18   Dahlia ByesBast, Elyana Grabski A, NP  loratadine-pseudoephedrine (CLARITIN-D 24-HOUR) 10-240 MG per 24 hr tablet Take 1 tablet by mouth daily as needed for allergies.    [provider]   metoprolol tartrate (LOPRESSOR) 25 MG tablet Take 1 tablet (25 mg total) by mouth 2 (two) times daily. 03/24/18   Arnaldo Nataliamond, Michael S, MD  ondansetron (ZOFRAN ODT) 4 MG disintegrating tablet Take 1 tablet (4 mg total) by mouth every 8 (eight) hours as needed for nausea or vomiting. 09/19/18   Janace ArisBast, Hannia Matchett A, NP    Family History Family History  Problem Relation Age of Onset  . Cancer Mother     Social History Social History   Tobacco Use  . Smoking status: Former Smoker    Packs/day: 0.00  . Smokeless tobacco: Never Used  Substance Use Topics  . Alcohol use: Yes    Comment: Regularly   . Drug use: No     Allergies   Fruit & vegetable daily [nutritional supplements]   Review of Systems Review of Systems   Physical Exam Triage Vital Signs ED Triage Vitals  Enc Vitals Group     BP 11/21/18 0952 (!) 170/96     Pulse Rate 11/21/18 0952 90     Resp 11/21/18 0952 18     Temp 11/21/18 0952 98.6 F (37 C)     Temp Source 11/21/18 0952 Oral     SpO2 11/21/18 0952 98 %     Weight 11/21/18 0951 250 lb (113.4 kg)     Height --      Head Circumference --  Peak Flow --      Pain Score 11/21/18 0951 2     Pain Loc --      Pain Edu? --      Excl. in GC? --    No data found.  Updated Vital Signs BP (!) 170/96 (BP Location: Right Arm)   Pulse 90   Temp 98.6 F (37 C) (Oral)   Resp 18   Wt 250 lb (113.4 kg)   SpO2 98%   BMI 34.87 kg/m   Visual Acuity Right Eye Distance:   Left Eye Distance:   Bilateral Distance:    Right Eye Near:   Left Eye Near:    Bilateral Near:     Physical Exam Vitals signs and nursing note reviewed.  Constitutional:      Appearance: Normal appearance.  HENT:     Head: Normocephalic and atraumatic.     Nose: Nose normal.  Eyes:     Conjunctiva/sclera: Conjunctivae normal.  Neck:     Musculoskeletal: Normal range of motion.  Pulmonary:     Effort: Pulmonary effort is normal.  Chest:     Breasts:        Right: Normal.         Left: Mass and tenderness present. No swelling, bleeding, inverted nipple, nipple discharge or skin change.       Comments: Approximated 2- 3 cm hard palpable mass.  Musculoskeletal: Normal range of motion.  Lymphadenopathy:     Upper Body:     Left upper body: No supraclavicular, axillary or pectoral adenopathy.  Skin:    General: Skin is warm and dry.  Neurological:     Mental Status: He is alert.  Psychiatric:        Mood and Affect: Mood normal.      UC Treatments / Results  Labs (all labs ordered are listed, but only abnormal results are displayed) Labs Reviewed  CYTOLOGY, (ORAL, ANAL, URETHRAL) ANCILLARY ONLY    EKG   Radiology No results found.  Procedures Procedures (including critical care time)  Medications Ordered in UC Medications - No data to display  Initial Impression / Assessment and Plan / UC Course  I have reviewed the triage vital signs and the nursing notes.  Pertinent labs & imaging results that were available during my care of the patient were reviewed by me and considered in my medical decision making (see chart for details).     Left breast mass palpated under and below the left nipple. Mildly tender. No erythema or nipple discharge.  Treating patient for possible underlying infection.  Treated with Keflex. Sending patient for diagnostic ultrasound STD screening done with results pending Final Clinical Impressions(s) / UC Diagnoses   Final diagnoses:  Screen for STD (sexually transmitted disease)  Breast mass in male     Discharge Instructions     Sending you for an ultrasound of the breast.  Trying a round of antibiotics in case this is some sort of infection  They will be calling you to schedule an appointment If ou have not heard from them by Friday I would give them a call.  Follow up as needed for continued or worsening symptoms    ED Prescriptions    Medication Sig Dispense Auth. Provider   cephALEXin (KEFLEX) 500 MG  capsule Take 1 capsule (500 mg total) by mouth 4 (four) times daily. 28 capsule Takiyah Bohnsack A, NP     PDMP not reviewed this encounter.   Jaci Lazier, Brenlee Koskela A,  NP 11/21/18 1116

## 2018-11-22 LAB — CYTOLOGY, (ORAL, ANAL, URETHRAL) ANCILLARY ONLY
Chlamydia: NEGATIVE
Molecular Disclaimer: NEGATIVE
Molecular Disclaimer: NEGATIVE
Molecular Disclaimer: NORMAL
Neisseria Gonorrhea: NEGATIVE
Trichomonas: NEGATIVE

## 2018-11-29 NOTE — ED Provider Notes (Signed)
[place]    CSN: [id number] Arrival date & time: 03/24/18  0347      History   Chief Complaint Chief Complaint  Patient presents with  . Hypertension    HPI George Rollins is a 33 y.o. male.   Patient is concerned about his BP which he says was high yesterday as it has been sporadically. Denies chest pain, or SOB.  Also concerned about non-painful genital lesions.  Denies penile discharge.     Past Medical History:  Diagnosis Date  . HTN (hypertension)     There are no active problems to display for this patient.   Past Surgical History:  Procedure Laterality Date  . HERNIA REPAIR    . ORIF ANKLE FRACTURE Right 08/28/2015   Procedure: OPEN REDUCTION INTERNAL FIXATION (ORIF)RIGHT  ANKLE LATERAL MALLEOUS FRACTURE ;  Surgeon: Wylene Simmer, MD;  Location: [place];  Service: Orthopedics;  Laterality: Right;       Home Medications    Prior to Admission medications   Medication Sig Start Date End Date Taking? Authorizing Provider  albuterol (PROVENTIL HFA;VENTOLIN HFA) 108 (90 BASE) MCG/ACT inhaler Inhale 2 puffs into the lungs every 6 (six) hours as needed for wheezing.    [provider]  benzonatate (TESSALON) 100 MG capsule Take 1 capsule (100 mg total) by mouth every 8 (eight) hours. 09/19/18   Loura Halt A, NP  cephALEXin (KEFLEX) 500 MG capsule Take 1 capsule (500 mg total) by mouth 4 (four) times daily. 11/21/18   Loura Halt A, NP  loratadine-pseudoephedrine (CLARITIN-D 24-HOUR) 10-240 MG per 24 hr tablet Take 1 tablet by mouth daily as needed for allergies.    [provider]  metoprolol tartrate (LOPRESSOR) 25 MG tablet Take 1 tablet (25 mg total) by mouth 2 (two) times daily. 03/24/18   Harrie Foreman, MD  ondansetron (ZOFRAN ODT) 4 MG disintegrating tablet Take 1 tablet (4 mg total) by mouth every 8 (eight) hours as needed for nausea or vomiting. 09/19/18   Orvan July, NP    Family History Family History   Problem Relation Age of Onset  . Cancer Mother     Social History Social History   Tobacco Use  . Smoking status: Former Smoker    Packs/day: 0.00  . Smokeless tobacco: Never Used  Substance Use Topics  . Alcohol use: Yes    Comment: Regularly   . Drug use: No     Allergies   Fruit & vegetable daily [nutritional supplements]   Review of Systems Review of Systems  Constitutional: Negative for chills and fever.  HENT: Negative for sore throat and tinnitus.   Eyes: Negative for redness.  Respiratory: Negative for cough and shortness of breath.   Cardiovascular: Negative for chest pain, palpitations and leg swelling.  Gastrointestinal: Negative for abdominal pain, diarrhea, nausea and vomiting.  Genitourinary: Positive for genital sores. Negative for discharge, dysuria, frequency, hematuria, penile pain and urgency.  Musculoskeletal: Negative for myalgias.  Skin: Negative for rash.       No lesions  Neurological: Negative for weakness.  Hematological: Does not bruise/bleed easily.  Psychiatric/Behavioral: Negative for suicidal ideas.     Physical Exam Triage Vital Signs ED Triage Vitals  Enc Vitals Group     BP 03/24/18 1027 123/84     Pulse Rate 03/24/18 1027 89     Resp 03/24/18 1027 18     Temp 03/24/18 1027 97.7 F (36.5 C)     Temp Source  03/24/18 1027 Oral     SpO2 03/24/18 1027 100 %     Weight --      Height --      Head Circumference --      Peak Flow --      Pain Score 03/24/18 1238 4     Pain Loc --      Pain Edu? --      Excl. in GC? --    No data found.  Updated Vital Signs BP 123/84 (BP Location: Right Arm)   Pulse 89   Temp 97.7 F (36.5 C) (Oral)   Resp 18   SpO2 100%   Visual Acuity Right Eye Distance:   Left Eye Distance:   Bilateral Distance:    Right Eye Near:   Left Eye Near:    Bilateral Near:     Physical Exam Constitutional:      General: He is not in acute distress.    Appearance: He is well-developed.  HENT:      Head: Normocephalic and atraumatic.  Eyes:     General: No scleral icterus.    Conjunctiva/sclera: Conjunctivae normal.     Pupils: Pupils are equal, round, and reactive to light.  Neck:     Musculoskeletal: Normal range of motion and neck supple.     Thyroid: No thyromegaly.     Vascular: No JVD.     Trachea: No tracheal deviation.  Cardiovascular:     Rate and Rhythm: Normal rate and regular rhythm.     Heart sounds: Normal heart sounds. No murmur. No friction rub. No gallop.   Pulmonary:     Effort: Pulmonary effort is normal. No respiratory distress.     Breath sounds: Normal breath sounds.  Abdominal:     General: Bowel sounds are normal. There is no distension.     Palpations: Abdomen is soft.     Tenderness: There is no abdominal tenderness.  Genitourinary:    Comments: Penile papules Musculoskeletal: Normal range of motion.  Lymphadenopathy:     Cervical: No cervical adenopathy.  Skin:    General: Skin is warm and dry.     Findings: No erythema or rash.  Neurological:     Mental Status: He is alert and oriented to person, place, and time.     Cranial Nerves: No cranial nerve deficit.  Psychiatric:        Behavior: Behavior normal.        Thought Content: Thought content normal.        Judgment: Judgment normal.      UC Treatments / Results  Labs (all labs ordered are listed, but only abnormal results are displayed) Labs Reviewed  COMPREHENSIVE METABOLIC PANEL  CBC  TSH  HIV ANTIBODY (ROUTINE TESTING W REFLEX)  RPR  POCT URINALYSIS DIP (DEVICE)  URINE CYTOLOGY ANCILLARY ONLY  SURGICAL PATHOLOGY    EKG   Radiology No results found.  Procedures Procedures (including critical care time)  Medications Ordered in UC Medications - No data to display  Initial Impression / Assessment and Plan / UC Course  I have reviewed the triage vital signs and the nursing notes.  Pertinent labs & imaging results that were available during my care of the patient  were reviewed by me and considered in my medical decision making (see chart for details).     D/w patient multiple variables that can raise BP.  Advised healthy diet and cardio exercise. He needs a PMD to follow BP.  Final Clinical Impressions(s) / UC Diagnoses   Final diagnoses:  Elevated blood pressure reading without diagnosis of hypertension  Genital warts   Discharge Instructions   None    ED Prescriptions    Medication Sig Dispense Auth. Provider   metoprolol tartrate (LOPRESSOR) 25 MG tablet Take 1 tablet (25 mg total) by mouth 2 (two) times daily. 60 tablet Arnaldo Nataliamond, Manasvi Dickard S, MD     PDMP not reviewed this encounter.   Arnaldo Nataliamond, Talin Rozeboom S, MD 11/29/18 1919

## 2018-11-30 ENCOUNTER — Ambulatory Visit: Payer: Self-pay

## 2018-11-30 ENCOUNTER — Ambulatory Visit
Admission: RE | Admit: 2018-11-30 | Discharge: 2018-11-30 | Disposition: A | Discharge status: Home / Self Care | Payer: Self-pay | Source: Ambulatory Visit | Attending: Family Medicine | Admitting: Family Medicine

## 2018-11-30 ENCOUNTER — Other Ambulatory Visit: Payer: Self-pay

## 2018-11-30 DIAGNOSIS — N6320 Unspecified lump in the left breast, unspecified quadrant: Secondary | ICD-10-CM

## 2019-03-30 ENCOUNTER — Other Ambulatory Visit: Payer: Self-pay

## 2019-03-30 ENCOUNTER — Encounter (HOSPITAL_COMMUNITY): Payer: Self-pay | Admitting: Emergency Medicine

## 2019-03-30 ENCOUNTER — Ambulatory Visit (HOSPITAL_COMMUNITY)
Admission: EM | Admit: 2019-03-30 | Discharge: 2019-03-30 | Disposition: A | Discharge status: Home / Self Care | Payer: Self-pay | Attending: Family Medicine | Admitting: Family Medicine

## 2019-03-30 DIAGNOSIS — R030 Elevated blood-pressure reading, without diagnosis of hypertension: Secondary | ICD-10-CM | POA: Insufficient documentation

## 2019-03-30 DIAGNOSIS — Z202 Contact with and (suspected) exposure to infections with a predominantly sexual mode of transmission: Secondary | ICD-10-CM | POA: Insufficient documentation

## 2019-03-30 DIAGNOSIS — R369 Urethral discharge, unspecified: Secondary | ICD-10-CM | POA: Insufficient documentation

## 2019-03-30 MED ORDER — LIDOCAINE HCL (PF) 1 % IJ SOLN
INTRAMUSCULAR | Status: AC
  Filled 2019-03-30: qty 2

## 2019-03-30 MED ORDER — DOXYCYCLINE HYCLATE 100 MG PO CAPS: 100.0000 mg | ORAL_CAPSULE | Freq: Two times a day (BID) | ORAL | 0 refills | Status: DC

## 2019-03-30 MED ORDER — CEFTRIAXONE SODIUM 500 MG IJ SOLR
INTRAMUSCULAR | Status: AC
  Filled 2019-03-30: qty 500

## 2019-03-30 MED ORDER — CEFTRIAXONE SODIUM 500 MG IJ SOLR
500.0000 mg | Freq: Once | INTRAMUSCULAR | Status: AC
  Administered 2019-03-30: 500 mg via INTRAMUSCULAR

## 2019-03-30 NOTE — Discharge Instructions (Signed)
You will be called if the culture is positive Take the antibiotic 2 x a day for 7 days Avoid sexual encounters until finished with antibiotic Safe sex is recommended  Your blood pressure is elevated.  This needs follow up with primary care

## 2019-03-30 NOTE — ED Triage Notes (Signed)
Pt states woke up this morning and had some penile discharge. states "it feels kinda weird and uncomfortable"

## 2019-03-30 NOTE — ED Provider Notes (Signed)
[place]    CSN: [id number] Arrival date & time: 03/30/19  9983      History   Chief Complaint Chief Complaint  Patient presents with  . SEXUALLY TRANSMITTED DISEASE    HPI George Rollins is a 34 y.o. male.   HPI  Patient is here for penile discharge.  He states that he has unprotected sexual relations.  He is here requesting treatment.  No fever chills.  No abdominal pain.  No rash. He also has a history of hypertension.  He stopped taking his medication.  He was informed that his blood pressure is elevated and he needs to go back on medication.  Patient is referred to her PCP for follow-up.  He has no headache, dizzy spells, shortness of breath, or symptomatology from his elevated blood pressure.  The risks of untreated blood pressure reviewed Past Medical History:  Diagnosis Date  . HTN (hypertension)     There are no problems to display for this patient.   Past Surgical History:  Procedure Laterality Date  . HERNIA REPAIR    . ORIF ANKLE FRACTURE Right 08/28/2015   Procedure: OPEN REDUCTION INTERNAL FIXATION (ORIF)RIGHT  ANKLE LATERAL MALLEOUS FRACTURE ;  Surgeon: Wylene Simmer, MD;  Location: [place];  Service: Orthopedics;  Laterality: Right;       Home Medications    Prior to Admission medications   Medication Sig Start Date End Date Taking? Authorizing Provider  albuterol (PROVENTIL HFA;VENTOLIN HFA) 108 (90 BASE) MCG/ACT inhaler Inhale 2 puffs into the lungs every 6 (six) hours as needed for wheezing.    [provider]  doxycycline (VIBRAMYCIN) 100 MG capsule Take 1 capsule (100 mg total) by mouth 2 (two) times daily. 03/30/19   Raylene Everts, MD  metoprolol tartrate (LOPRESSOR) 25 MG tablet Take 1 tablet (25 mg total) by mouth 2 (two) times daily. Patient not taking: Reported on 03/30/2019 03/24/18 03/30/19  Harrie Foreman, MD    Family History Family History  Problem Relation Age of Onset  . Cancer Mother    . Breast cancer Maternal Grandfather     Social History Social History   Tobacco Use  . Smoking status: Former Smoker    Packs/day: 0.00  . Smokeless tobacco: Never Used  Substance Use Topics  . Alcohol use: Yes    Comment: Regularly   . Drug use: No     Allergies   Fruit & vegetable daily [nutritional supplements]   Review of Systems Review of Systems  Eyes: Negative for visual disturbance.  Gastrointestinal: Negative for nausea and vomiting.  Genitourinary: Positive for discharge. Negative for dysuria and penile pain.  Skin: Negative for rash.  Neurological: Negative for dizziness, light-headedness and headaches.     Physical Exam Triage Vital Signs ED Triage Vitals  Enc Vitals Group     BP 03/30/19 1037 (!) 143/105     Pulse Rate 03/30/19 1037 97     Resp 03/30/19 1037 14     Temp 03/30/19 1037 98.3 F (36.8 C)     Temp src --      SpO2 03/30/19 1037 100 %     Weight --      Height --      Head Circumference --      Peak Flow --      Pain Score 03/30/19 1035 0     Pain Loc --      Pain Edu? --      Excl.  in GC? --    No data found.  Updated Vital Signs BP (!) 143/105   Pulse 97   Temp 98.3 F (36.8 C)   Resp 14   SpO2 100%      Physical Exam Constitutional:      General: He is not in acute distress.    Appearance: Normal appearance. He is well-developed.  HENT:     Head: Normocephalic and atraumatic.     Mouth/Throat:     Comments: Mask in place Eyes:     Conjunctiva/sclera: Conjunctivae normal.     Pupils: Pupils are equal, round, and reactive to light.  Cardiovascular:     Rate and Rhythm: Normal rate.  Pulmonary:     Effort: Pulmonary effort is normal. No respiratory distress.  Abdominal:     General: There is no distension.     Palpations: Abdomen is soft.  Genitourinary:    Penis: Normal.      Comments: Normal circumcised penis.  No rash.  Yellow-green discharge noted at tip Musculoskeletal:        General: Normal range of  motion.     Cervical back: Normal range of motion.  Skin:    General: Skin is warm and dry.  Neurological:     Mental Status: He is alert.      UC Treatments / Results  Labs (all labs ordered are listed, but only abnormal results are displayed) Labs Reviewed  CYTOLOGY, (ORAL, ANAL, URETHRAL) ANCILLARY ONLY    EKG   Radiology No results found.  Procedures Procedures (including critical care time)  Medications Ordered in UC Medications  cefTRIAXone (ROCEPHIN) injection 500 mg (500 mg Intramuscular Given 03/30/19 1109)    Initial Impression / Assessment and Plan / UC Course  I have reviewed the triage vital signs and the nursing notes.  Pertinent labs & imaging results that were available during my care of the patient were reviewed by me and considered in my medical decision making (see chart for details).     Reviewed the benefits of safe sex.  Reviewed treatment of STDs in men.  Emphasized the importance of 7 full days of doxycycline Final Clinical Impressions(s) / UC Diagnoses   Final diagnoses:  Abnormal penile discharge  Possible exposure to STD  Elevated blood pressure reading     Discharge Instructions     You will be called if the culture is positive Take the antibiotic 2 x a day for 7 days Avoid sexual encounters until finished with antibiotic Safe sex is recommended  Your blood pressure is elevated.  This needs follow up with primary care   ED Prescriptions    Medication Sig Dispense Auth. Provider   doxycycline (VIBRAMYCIN) 100 MG capsule Take 1 capsule (100 mg total) by mouth 2 (two) times daily. 14 capsule Eustace Moore, MD     PDMP not reviewed this encounter.   Eustace Moore, MD 03/30/19 1124

## 2019-04-02 LAB — CYTOLOGY, (ORAL, ANAL, URETHRAL) ANCILLARY ONLY
Chlamydia: NEGATIVE
Neisseria Gonorrhea: NEGATIVE
Trichomonas: NEGATIVE

## 2019-10-31 ENCOUNTER — Other Ambulatory Visit: Payer: Self-pay

## 2019-10-31 DIAGNOSIS — Z20822 Contact with and (suspected) exposure to covid-19: Secondary | ICD-10-CM

## 2019-11-02 LAB — SARS-COV-2, NAA 2 DAY TAT

## 2019-11-02 LAB — NOVEL CORONAVIRUS, NAA: SARS-CoV-2, NAA: NOT DETECTED

## 2020-07-11 ENCOUNTER — Encounter (HOSPITAL_COMMUNITY): Payer: Self-pay | Admitting: Emergency Medicine

## 2021-06-17 IMAGING — MG MM DIGITAL DIAGNOSTIC BILAT W/ TOMO W/ CAD
6 of 10 series · 6 of 30 positions shown · non-contrast
Comparison: None.

CLINICAL DATA: 32-year-old male presenting with a tender palpable
lump behind the LEFT nipple which initially felt approximately 1
month ago. This is the patient's initial mammogram.

Family history of breast cancer in his maternal grandfather.
EXAM:
DIGITAL DIAGNOSTIC BILATERAL MAMMOGRAM WITH CAD AND TOMO

[R MLO synth-2D]
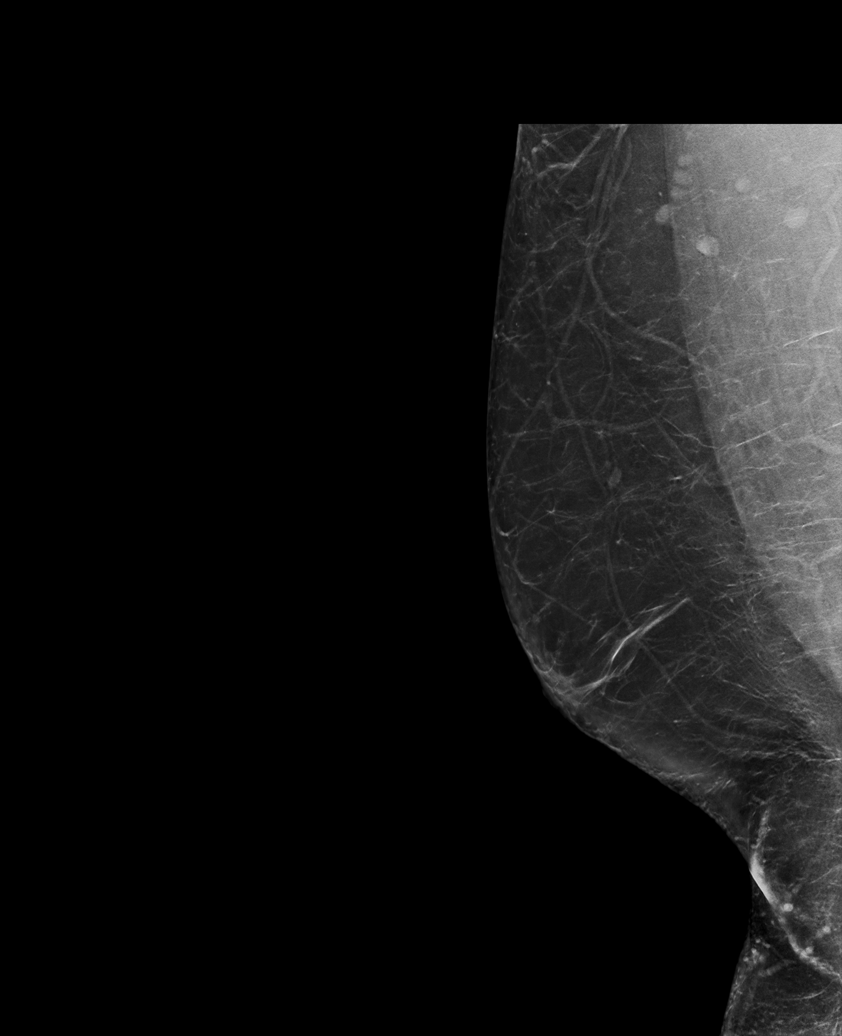

[L CC synth-2D]
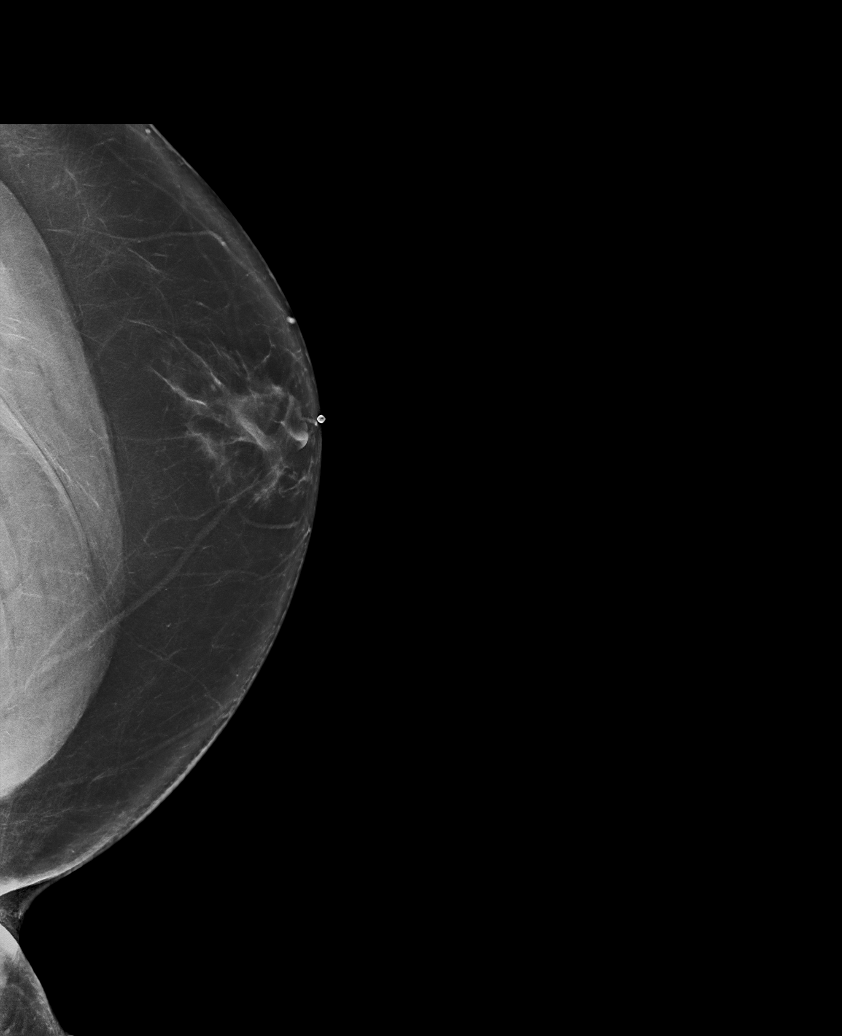

[L MLO synth-2D (1 of 2)]
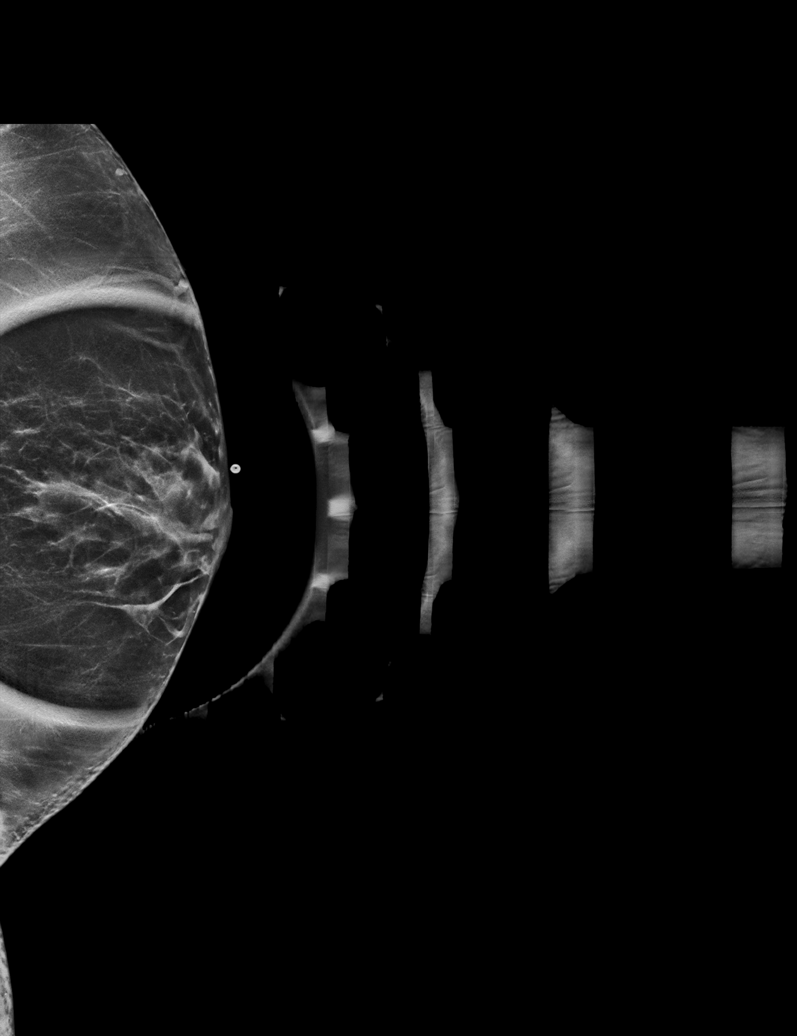

[L MLO synth-2D (2 of 2)]
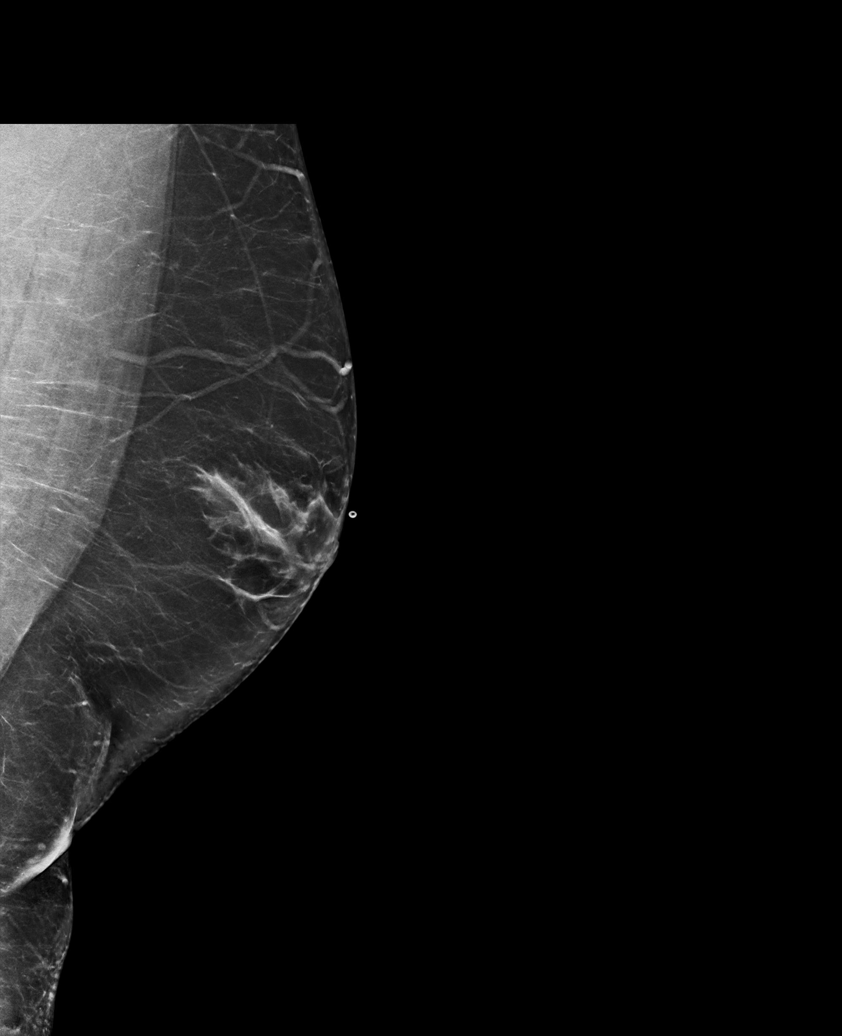

[R CC synth-2D]
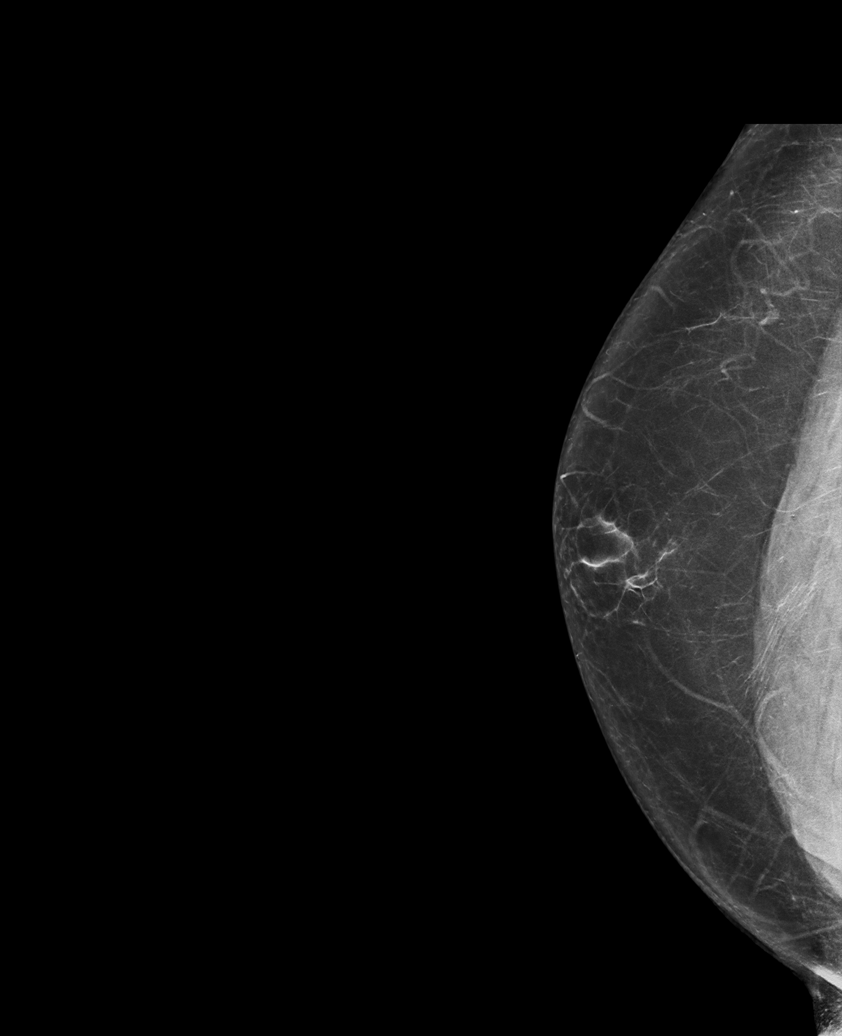

[R MLO tomo · tomo slice 43/86.0]
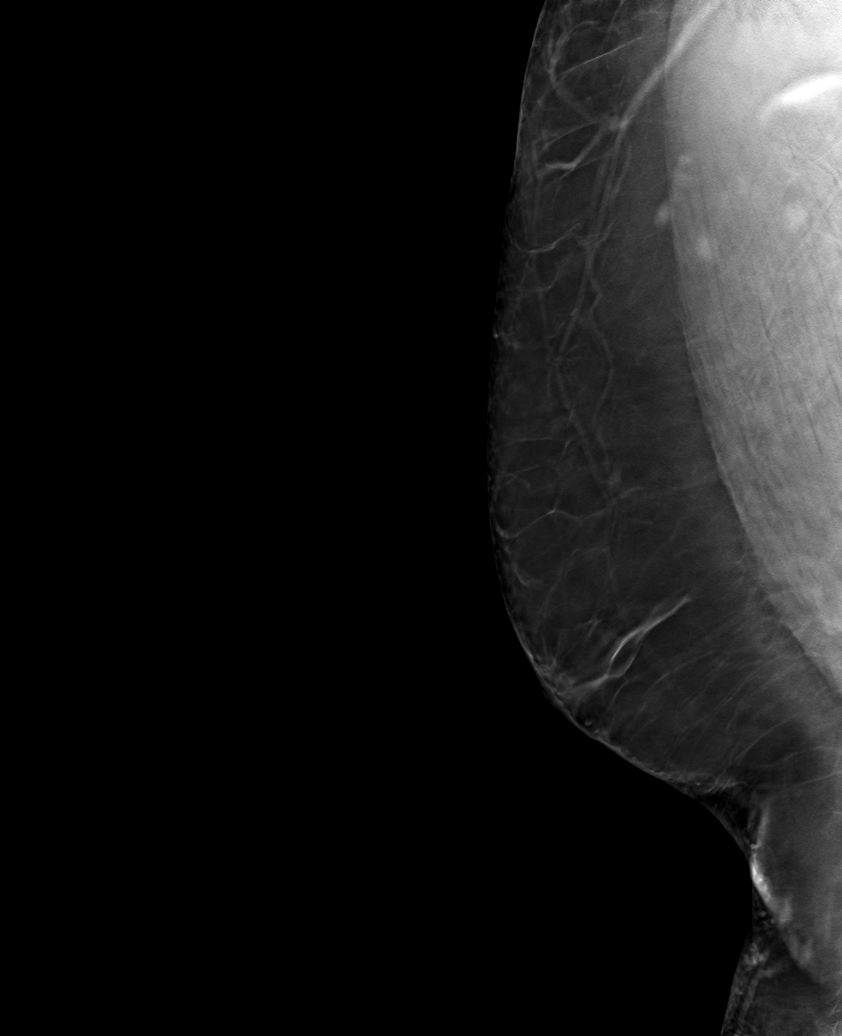

[6 of 30 positions shown; findings below may reference images not displayed]

ACR Breast Density Category b: There are scattered areas of
fibroglandular density.
FINDINGS: Tomosynthesis and synthesized full field CC and MLO views of both
breasts were obtained. Tomosynthesis and synthesized spot
compression tangential view of the area of concern in the subareolar
LEFT breast was also obtained.

Moderate LEFT gynecomastia. Minimal RIGHT gynecomastia. No findings
suspicious for malignancy in either breast.

Mammographic images were processed with CAD.
IMPRESSION: 1. Moderate LEFT gynecomastia. This accounts for the palpable
concern.
2. Minimal RIGHT gynecomastia.
3. No mammographic evidence of malignancy involving either breast.

RECOMMENDATION:
No further imaging follow-up is felt necessary unless there are
persistent or subsequent clinical concerns.

I discussed with the patient the fact that gynecomastia can occur in
men as testosterone levels decrease with age or in younger men with
low testosterone levels, causing a change in the serum
testosterone:estrogen ratio. We also discussed other potential
etiologies of gynecomastia including numerous prescription
medications and recreational drugs (marijuana and anabolic steroids
in particular). Approximately 20% of cases of gynecomastia are
idiopathic.

I counseled the patient to perform self-examination to make sure
that a hard lump does not develop which could indicate malignancy
and would need further evaluation. We also discussed the possibility
of surgical excision if symptoms continue and if an etiology of the
gynecomastia cannot be determined and therefore corrected.

I have discussed the findings and recommendations with the patient.

BI-RADS CATEGORY  2: Benign.

## 2021-08-18 NOTE — Progress Notes (Signed)
Subjective:    George Rollins - 36 y.o. male MRN [id number]  Date of birth: [birth year]  HPI  George Rollins is to establish care.  Current issues and/or concerns: - History of hypertension. Prescribed medication in the past. Currently not taking blood pressure medication for several months. Checking blood pressures at home and similar to today's reading. Does not eat many salty foods. He does exercise outside of his normal routine. He does not smoke. Denies red flag symptoms such as but not limited to chest pain, shortness of breath, and worst headache of life.  - History of asthma. Needs Albuterol inhaler refill.  - Reports snoring.  - No further issues/concerns.    ROS per HPI   Health Maintenance: Health Maintenance Due  Topic Date Due   Hepatitis C Screening  Never done     Past Medical History: There are no problems to display for this patient.   Social History   reports that he has quit smoking. His smoking use included cigarettes. He has never used smokeless tobacco. He reports current alcohol use. He reports that he does not use drugs.   Family History  family history includes Breast cancer in his maternal grandfather; Cancer in his mother.   Medications: reviewed and updated   Objective:   Physical Exam BP (!) 145/101   Pulse 97   Temp 98.1 F (36.7 C) (Oral)   Resp 16   Ht 5\' 11"  (1.803 m)   Wt 239 lb (108.4 kg)   SpO2 95%   BMI 33.33 kg/m   Physical Exam HENT:     Head: Normocephalic and atraumatic.  Eyes:     Extraocular Movements: Extraocular movements intact.     Conjunctiva/sclera: Conjunctivae normal.     Pupils: Pupils are equal, round, and reactive to light.  Cardiovascular:     Rate and Rhythm: Normal rate and regular rhythm.     Pulses: Normal pulses.     Heart sounds: Normal heart sounds.  Pulmonary:     Effort: Pulmonary effort is normal.  Musculoskeletal:     Cervical back: Normal range of motion and neck supple.   Neurological:     General: No focal deficit present.     Mental Status: He is alert and oriented to person, place, and time.  Psychiatric:        Mood and Affect: Mood normal.        Behavior: Behavior normal.      Assessment & Plan:  1. Encounter to establish care - Patient presents today to establish care.  - Return for annual physical examination, labs, and health maintenance. Arrive fasting meaning having no food for at least 8 hours prior to appointment. You may have only water or black coffee. Please take scheduled medications as normal.  2. Essential (primary) hypertension - Blood pressure not at goal during today's visit. Patient asymptomatic without chest pressure, chest pain, palpitations, shortness of breath, worst headache of life, and any additional red flag symptoms. - Begin Valsartan as prescribed. Counseled on medication adherence and adverse effects.  - Counseled on blood pressure goal of less than 130/80, low-sodium, DASH diet, medication compliance, and 150 minutes of moderate intensity exercise per week as tolerated. Counseled on medication adherence and adverse effects. - BMP to evaluate kidney function and electrolyte balance. - Follow-up with primary provider in 2 weeks or sooner if needed.  - valsartan (DIOVAN) 40 MG tablet; Take 1 tablet (40 mg total) by mouth daily.  Dispense: 30  tablet; Refill: 0 - Basic Metabolic Panel  3. Mild intermittent asthma without complication - Albuterol inhaler as prescribed. Counseled on medication adherence and adverse effects.  - Follow-up with primary provider as scheduled. - albuterol (VENTOLIN HFA) 108 (90 Base) MCG/ACT inhaler; Inhale 2 puffs into the lungs every 6 (six) hours as needed for wheezing or shortness of breath.  Dispense: 8 g; Refill: 2  4. Snoring - Sleep study for further evaluation.  - PSG Sleep Study; Future    Patient was given clear instructions to go to Emergency Department or return to medical center  if symptoms don't improve, worsen, or new problems develop.The patient verbalized understanding.  I discussed the assessment and treatment plan with the patient. The patient was provided an opportunity to ask questions and all were answered. The patient agreed with the plan and demonstrated an understanding of the instructions.   The patient was advised to call back or seek an in-person evaluation if the symptoms worsen or if the condition fails to improve as anticipated.    Ricky Stabs, NP 08/28/2021, 8:16 PM [facility] at [facility]

## 2021-08-28 ENCOUNTER — Encounter: Payer: Self-pay | Admitting: Family

## 2021-08-28 ENCOUNTER — Ambulatory Visit (INDEPENDENT_AMBULATORY_CARE_PROVIDER_SITE_OTHER): Payer: BC Managed Care – PPO | Admitting: Family

## 2021-08-28 VITALS — BP 145/101 | HR 97 | Temp 98.1°F | Resp 16 | Ht 71.0 in | Wt 239.0 lb

## 2021-08-28 DIAGNOSIS — Z7689 Persons encountering health services in other specified circumstances: Secondary | ICD-10-CM

## 2021-08-28 DIAGNOSIS — I10 Essential (primary) hypertension: Secondary | ICD-10-CM

## 2021-08-28 DIAGNOSIS — J4520 Mild intermittent asthma, uncomplicated: Secondary | ICD-10-CM | POA: Diagnosis not present

## 2021-08-28 DIAGNOSIS — R0683 Snoring: Secondary | ICD-10-CM | POA: Diagnosis not present

## 2021-08-28 MED ORDER — ALBUTEROL SULFATE HFA 108 (90 BASE) MCG/ACT IN AERS: 2.0000 | INHALATION_SPRAY | Freq: Four times a day (QID) | RESPIRATORY_TRACT | 2 refills | Status: DC | PRN

## 2021-08-28 MED ORDER — VALSARTAN 40 MG PO TABS: 40.0000 mg | ORAL_TABLET | Freq: Every day | ORAL | 0 refills | Status: DC

## 2021-08-28 NOTE — Progress Notes (Signed)
Patient is here to establish care with pcp. Patient has a few chronic issues.  Patient would like to talk about sleep apnea

## 2021-08-28 NOTE — Patient Instructions (Signed)
Thank you for choosing Primary Care at [facility] for your medical home!    George Rollins was seen by Rema Fendt, NP today.   George Rollins's primary care provider is Ricky Stabs, NP.   For the best care possible,  you should try to see Ricky Stabs, NP whenever you come to office.   We look forward to seeing you again soon!  If you have any questions about your visit today,  please call us at [phone]  Or feel free to reach your provider via MyChart.    Keeping you healthy   Get these tests Blood pressure- Have your blood pressure checked once a year by your healthcare provider.  Normal blood pressure is 120/80. Weight- Have your body mass index (BMI) calculated to screen for obesity.  BMI is a measure of body fat based on height and weight. You can also calculate your own BMI at https://www.west-esparza.com/. Cholesterol- Have your cholesterol checked regularly starting at age 51, sooner may be necessary if you have diabetes, high blood pressure, if a family member developed heart diseases at an early age or if you smoke.  Chlamydia, HIV, and other sexual transmitted disease- Get screened each year until the age of 37 then within three months of each new sexual partner. Diabetes- Have your blood sugar checked regularly if you have high blood pressure, high cholesterol, a family history of diabetes or if you are overweight.   Get these vaccines Flu shot- Every fall. Tetanus shot- Every 10 years. Menactra- Single dose; prevents meningitis.   Take these steps Don't smoke- If you do smoke, ask your healthcare provider about quitting. For tips on how to quit, go to www.smokefree.gov or call 1-800-QUIT-NOW. Be physically active- Exercise 5 days a week for at least 30 minutes.  If you are not already physically active start slow and gradually work up to 30 minutes of moderate physical activity.  Examples of moderate activity include walking briskly, mowing the  yard, dancing, swimming bicycling, etc. Eat a healthy diet- Eat a variety of healthy foods such as fruits, vegetables, low fat milk, low fat cheese, yogurt, lean meats, poultry, fish, beans, tofu, etc.  For more information on healthy eating, go to www.thenutritionsource.org Drink alcohol in moderation- Limit alcohol intake two drinks or less a day.  Never drink and drive. Dentist- Brush and floss teeth twice daily; visit your dentis twice a year. Depression-Your emotional health is as important as your physical health.  If you're feeling down, losing interest in things you normally enjoy please talk with your healthcare provider. Gun Safety- If you keep a gun in your home, keep it unloaded and with the safety lock on.  Bullets should be stored separately. Helmet use- Always wear a helmet when riding a motorcycle, bicycle, rollerblading or skateboarding. Safe sex- If you may be exposed to a sexually transmitted infection, use a condom Seat belts- Seat bels can save your life; always wear one. Smoke/Carbon Monoxide detectors- These detectors need to be installed on the appropriate level of your home.  Replace batteries at least once a year. Skin Cancer- When out in the sun, cover up and use sunscreen SPF 15 or higher. Violence- If anyone is threatening or hurting you, please tell your healthcare provider.

## 2021-08-29 LAB — BASIC METABOLIC PANEL
BUN/Creatinine Ratio: 12 (ref 9–20)
BUN: 11 mg/dL (ref 6–20)
CO2: 23 mmol/L (ref 20–29)
Calcium: 9.1 mg/dL (ref 8.7–10.2)
Chloride: 105 mmol/L (ref 96–106)
Creatinine, Ser: 0.9 mg/dL (ref 0.76–1.27)
Glucose: 94 mg/dL (ref 70–99)
Potassium: 4 mmol/L (ref 3.5–5.2)
Sodium: 144 mmol/L (ref 134–144)
eGFR: 113 mL/min/{1.73_m2} (ref >59)

## 2021-09-01 NOTE — Progress Notes (Signed)
Patient ID: Jkai Arwood, male    DOB: 1985/12/02  MRN: [id number]  CC: Annual Physical Exam  Subjective: George Rollins is a 36 y.o. male who presents for annual physical exam.   His concerns today include:  - Doing well on Valsartan without issues/concerns.  - Has not heard from sleep study as of yet.    There are no problems to display for this patient.    Current Outpatient Medications on File Prior to Visit  Medication Sig Dispense Refill   albuterol (VENTOLIN HFA) 108 (90 Base) MCG/ACT inhaler Inhale 2 puffs into the lungs every 6 (six) hours as needed for wheezing or shortness of breath. 8 g 2   [DISCONTINUED] metoprolol tartrate (LOPRESSOR) 25 MG tablet Take 1 tablet (25 mg total) by mouth 2 (two) times daily. (Patient not taking: Reported on 03/30/2019) 60 tablet 1   No current facility-administered medications on file prior to visit.    Allergies  Allergen Reactions   Fruit & Vegetable Daily [Nutritional Supplements]     freshf ruit    Social History   Socioeconomic History   Marital status: Unknown    Spouse name: Not on file   Number of children: Not on file   Years of education: Not on file   Highest education level: Not on file  Occupational History   Not on file  Tobacco Use   Smoking status: Former    Packs/day: 0.00    Types: Cigarettes    Passive exposure: Past   Smokeless tobacco: Never  Vaping Use   Vaping Use: Never used  Substance and Sexual Activity   Alcohol use: Yes    Comment: Regularly    Drug use: No   Sexual activity: Yes  Other Topics Concern   Not on file  Social History Narrative   ** Merged History Encounter **       Social Determinants of Health   Financial Resource Strain: Not on file  Food Insecurity: Not on file  Transportation Needs: Not on file  Physical Activity: Not on file  Stress: Not on file  Social Connections: Not on file  Intimate Partner Violence: Not on file    Family History  Problem  Relation Age of Onset   Cancer Mother    Breast cancer Maternal Grandfather     Past Surgical History:  Procedure Laterality Date   HERNIA REPAIR     ORIF ANKLE FRACTURE Right 08/28/2015   Procedure: OPEN REDUCTION INTERNAL FIXATION (ORIF)RIGHT  ANKLE LATERAL MALLEOUS FRACTURE ;  Surgeon: Wylene Simmer, MD;  Location: [facility];  Service: Orthopedics;  Laterality: Right;    ROS: Review of Systems Negative except as stated above  PHYSICAL EXAM: BP 132/86 (BP Location: Left Arm, Patient Position: Sitting, Cuff Size: Large)   Pulse (!) 112   Temp 98.3 F (36.8 C)   Resp 16   Ht 5' 10.98" (1.803 m)   Wt 237 lb (107.5 kg)   SpO2 95%   BMI 33.07 kg/m   Physical Exam HENT:     Head: Normocephalic and atraumatic.     Right Ear: Tympanic membrane, ear canal and external ear normal.     Left Ear: Tympanic membrane, ear canal and external ear normal.     Nose: Nose normal.     Mouth/Throat:     Mouth: Mucous membranes are moist.     Pharynx: Oropharynx is clear.  Eyes:     Extraocular Movements: Extraocular movements intact.  Conjunctiva/sclera: Conjunctivae normal.     Pupils: Pupils are equal, round, and reactive to light.  Cardiovascular:     Rate and Rhythm: Tachycardia present.     Pulses: Normal pulses.     Heart sounds: Normal heart sounds.  Pulmonary:     Effort: Pulmonary effort is normal.     Breath sounds: Normal breath sounds.  Abdominal:     General: Bowel sounds are normal.     Palpations: Abdomen is soft.  Genitourinary:    Comments: Patient declined.  Musculoskeletal:        General: Normal range of motion.     Right shoulder: Normal.     Left shoulder: Normal.     Right upper arm: Normal.     Left upper arm: Normal.     Right elbow: Normal.     Left elbow: Normal.     Right forearm: Normal.     Left forearm: Normal.     Right wrist: Normal.     Left wrist: Normal.     Right hand: Normal.     Left hand: Normal.     Cervical back:  Normal, normal range of motion and neck supple.     Thoracic back: Normal.     Lumbar back: Normal.     Right hip: Normal.     Left hip: Normal.     Right upper leg: Normal.     Left upper leg: Normal.     Right knee: Normal.     Left knee: Normal.     Right lower leg: Normal.     Left lower leg: Normal.     Right ankle: Normal.     Left ankle: Normal.     Right foot: Normal.     Left foot: Normal.  Skin:    General: Skin is warm and dry.     Capillary Refill: Capillary refill takes less than 2 seconds.  Neurological:     General: No focal deficit present.     Mental Status: He is alert and oriented to person, place, and time.  Psychiatric:        Mood and Affect: Mood normal.        Behavior: Behavior normal.    Results for orders placed or performed in visit on 09/11/21  POCT URINALYSIS DIP (CLINITEK)  Result Value Ref Range   Color, UA yellow yellow   Clarity, UA clear clear   Glucose, UA negative negative mg/dL   Bilirubin, UA negative negative   Ketones, POC UA trace (5) (A) negative mg/dL   Spec Grav, UA >=1.030 (A) 1.010 - 1.025   Blood, UA negative negative   pH, UA 5.5 5.0 - 8.0   POC PROTEIN,UA =30 (A) negative, trace   Urobilinogen, UA 0.2 0.2 or 1.0 E.U./dL   Nitrite, UA Negative Negative   Leukocytes, UA Negative Negative    ASSESSMENT AND PLAN: 1. Annual physical exam - Counseled on 150 minutes of exercise per week as tolerated, healthy eating (including decreased daily intake of saturated fats, cholesterol, added sugars, sodium), STI prevention, and routine healthcare maintenance.  2. Screening for deficiency anemia - CBC to screen for anemia. - CBC  3. Screening for diabetes mellitus (DM) - Hemoglobin A1c to screen for pre-diabetes/diabetes. - Hemoglobin A1c  4. Screening for lipid disorders - Lipid panel to screen for high cholesterol.  - Lipid panel  5. Screening for thyroid disorder - TSH to check thyroid function.  - TSH  6. Screen  for STD (sexually transmitted disease) - No urinary tract infection.  - Routine screening gonorrhea, chlamydia, trichomonas, syphilis.  - Urine cytology ancillary only - RPR - POCT URINALYSIS DIP (CLINITEK)  7. Encounter for screening for HIV - HIV antibody to screen for human immunodeficiency virus.  - HIV antibody (with reflex)  8. Need for hepatitis C screening test - Hepatitis C antibody to screen for hepatitis C.  - Hepatitis C antibody  9. Essential (primary) hypertension - Blood pressure improved.  - Continue Valsartan as prescribed.  - Counseled on blood pressure goal of less than 130/80, low-sodium, DASH diet, medication compliance, and 150 minutes of moderate intensity exercise per week as tolerated. Counseled on medication adherence and adverse effects. - CMP14+EGFR to check kidney function, liver function, and electrolyte balance.  - Follow-up with primary provider in 4 months or sooner if needed.  - CMP14+EGFR - valsartan (DIOVAN) 40 MG tablet; Take 1 tablet (40 mg total) by mouth daily.  Dispense: 30 tablet; Refill: 3  10. Snoring - Patient reports has not heard from sleep study referral. New orders today.  - PSG Sleep Study; Future - Ambulatory referral to Sleep Studies    Patient was given the opportunity to ask questions.  Patient verbalized understanding of the plan and was able to repeat key elements of the plan. Patient was given clear instructions to go to Emergency Department or return to medical center if symptoms don't improve, worsen, or new problems develop.The patient verbalized understanding.   Orders Placed This Encounter  Procedures   RPR   Hepatitis C antibody   HIV antibody (with reflex)   Lipid panel   TSH   CMP14+EGFR   CBC   Hemoglobin A1c   Ambulatory referral to Sleep Studies   POCT URINALYSIS DIP (CLINITEK)   PSG Sleep Study     Requested Prescriptions   Signed Prescriptions Disp Refills   valsartan (DIOVAN) 40 MG tablet 30  tablet 3    Sig: Take 1 tablet (40 mg total) by mouth daily.    Return in about 1 year (around 09/12/2022) for Physical per patient preference, Follow-Up or next available 4 months chronic care mgmt .  Camillia Herter, NP

## 2021-09-11 ENCOUNTER — Ambulatory Visit: Payer: BC Managed Care – PPO | Admitting: Family

## 2021-09-11 ENCOUNTER — Other Ambulatory Visit (HOSPITAL_COMMUNITY)
Admission: RE | Admit: 2021-09-11 | Discharge: 2021-09-11 | Disposition: A | Discharge status: Home / Self Care | Payer: BC Managed Care – PPO | Source: Ambulatory Visit | Attending: Family | Admitting: Family

## 2021-09-11 ENCOUNTER — Encounter: Payer: Self-pay | Admitting: Family

## 2021-09-11 VITALS — BP 132/86 | HR 112 | Temp 98.3°F | Resp 16 | Ht 70.98 in | Wt 237.0 lb

## 2021-09-11 DIAGNOSIS — Z114 Encounter for screening for human immunodeficiency virus [HIV]: Secondary | ICD-10-CM | POA: Diagnosis not present

## 2021-09-11 DIAGNOSIS — Z13220 Encounter for screening for lipoid disorders: Secondary | ICD-10-CM | POA: Diagnosis not present

## 2021-09-11 DIAGNOSIS — Z131 Encounter for screening for diabetes mellitus: Secondary | ICD-10-CM | POA: Diagnosis not present

## 2021-09-11 DIAGNOSIS — R0683 Snoring: Secondary | ICD-10-CM

## 2021-09-11 DIAGNOSIS — Z1159 Encounter for screening for other viral diseases: Secondary | ICD-10-CM | POA: Diagnosis not present

## 2021-09-11 DIAGNOSIS — Z1329 Encounter for screening for other suspected endocrine disorder: Secondary | ICD-10-CM

## 2021-09-11 DIAGNOSIS — Z130 Encounter for screening for diseases of the blood and blood-forming organs and certain disorders involving the immune mechanism: Secondary | ICD-10-CM

## 2021-09-11 DIAGNOSIS — Z113 Encounter for screening for infections with a predominantly sexual mode of transmission: Secondary | ICD-10-CM

## 2021-09-11 DIAGNOSIS — Z0001 Encounter for general adult medical examination with abnormal findings: Secondary | ICD-10-CM

## 2021-09-11 DIAGNOSIS — I10 Essential (primary) hypertension: Secondary | ICD-10-CM | POA: Diagnosis not present

## 2021-09-11 DIAGNOSIS — Z0000 Encounter for general adult medical examination without abnormal findings: Secondary | ICD-10-CM

## 2021-09-11 LAB — POCT URINALYSIS DIP (CLINITEK)
Bilirubin, UA: NEGATIVE
Blood, UA: NEGATIVE
Glucose, UA: NEGATIVE mg/dL
Leukocytes, UA: NEGATIVE
Nitrite, UA: NEGATIVE
POC PROTEIN,UA: 30 — AB
Spec Grav, UA: >=1.03 — AB (ref 1.010–1.025)
Urobilinogen, UA: 0.2 E.U./dL
pH, UA: 5.5 (ref 5.0–8.0)

## 2021-09-11 MED ORDER — VALSARTAN 40 MG PO TABS: 40.0000 mg | ORAL_TABLET | Freq: Every day | ORAL | 3 refills | Status: DC

## 2021-09-11 NOTE — Progress Notes (Signed)
.  Pt presents for hypertension f/u, pt desires STD screening and HIV testing

## 2021-09-12 ENCOUNTER — Encounter: Payer: Self-pay | Admitting: Family

## 2021-09-12 DIAGNOSIS — R7303 Prediabetes: Secondary | ICD-10-CM | POA: Insufficient documentation

## 2021-09-12 LAB — CMP14+EGFR
ALT: 34 IU/L (ref 0–44)
AST: 17 IU/L (ref 0–40)
Albumin/Globulin Ratio: 1.6 (ref 1.2–2.2)
Albumin: 4.5 g/dL (ref 4.1–5.1)
Alkaline Phosphatase: 89 IU/L (ref 44–121)
BUN/Creatinine Ratio: 10 (ref 9–20)
BUN: 10 mg/dL (ref 6–20)
Bilirubin Total: 0.3 mg/dL (ref 0.0–1.2)
CO2: 22 mmol/L (ref 20–29)
Calcium: 9.4 mg/dL (ref 8.7–10.2)
Chloride: 107 mmol/L — ABNORMAL HIGH (ref 96–106)
Creatinine, Ser: 1.03 mg/dL (ref 0.76–1.27)
Globulin, Total: 2.8 g/dL (ref 1.5–4.5)
Glucose: 118 mg/dL — ABNORMAL HIGH (ref 70–99)
Potassium: 3.7 mmol/L (ref 3.5–5.2)
Sodium: 144 mmol/L (ref 134–144)
Total Protein: 7.3 g/dL (ref 6.0–8.5)
eGFR: 96 mL/min/{1.73_m2} (ref >59)

## 2021-09-12 LAB — CBC
Hematocrit: 43.6 % (ref 37.5–51.0)
Hemoglobin: 14.6 g/dL (ref 13.0–17.7)
MCH: 29.5 pg (ref 26.6–33.0)
MCHC: 33.5 g/dL (ref 31.5–35.7)
MCV: 88 fL (ref 79–97)
Platelets: 254 10*3/uL (ref 150–450)
RBC: 4.95 x10E6/uL (ref 4.14–5.80)
RDW: 13.6 % (ref 11.6–15.4)
WBC: 8.3 10*3/uL (ref 3.4–10.8)

## 2021-09-12 LAB — LIPID PANEL
Chol/HDL Ratio: 3.4 ratio (ref 0.0–5.0)
Cholesterol, Total: 172 mg/dL (ref 100–199)
HDL: 51 mg/dL (ref >39)
LDL Chol Calc (NIH): 98 mg/dL (ref 0–99)
Triglycerides: 131 mg/dL (ref 0–149)
VLDL Cholesterol Cal: 23 mg/dL (ref 5–40)

## 2021-09-12 LAB — TSH: TSH: 1.08 u[IU]/mL (ref 0.450–4.500)

## 2021-09-12 LAB — HIV ANTIBODY (ROUTINE TESTING W REFLEX): HIV Screen 4th Generation wRfx: NONREACTIVE

## 2021-09-12 LAB — HEMOGLOBIN A1C
Est. average glucose Bld gHb Est-mCnc: 117 mg/dL
Hgb A1c MFr Bld: 5.7 % — ABNORMAL HIGH (ref 4.8–5.6)

## 2021-09-12 LAB — RPR: RPR Ser Ql: NONREACTIVE

## 2021-09-12 LAB — HEPATITIS C ANTIBODY: Hep C Virus Ab: NONREACTIVE

## 2021-09-14 LAB — URINE CYTOLOGY ANCILLARY ONLY
Chlamydia: NEGATIVE
Comment: NEGATIVE
Comment: NEGATIVE
Comment: NORMAL
Neisseria Gonorrhea: NEGATIVE
Trichomonas: NEGATIVE

## 2021-09-21 ENCOUNTER — Other Ambulatory Visit: Payer: Self-pay | Admitting: Family

## 2021-09-21 ENCOUNTER — Encounter: Payer: Self-pay | Admitting: Family

## 2021-09-21 DIAGNOSIS — R0683 Snoring: Secondary | ICD-10-CM

## 2021-09-25 ENCOUNTER — Other Ambulatory Visit: Payer: Self-pay | Admitting: Family

## 2021-09-25 DIAGNOSIS — I10 Essential (primary) hypertension: Secondary | ICD-10-CM

## 2021-09-25 NOTE — Telephone Encounter (Signed)
Valsartan ordered 09/11/2021 x 3 refills. Call patient to determine medical necessity of early refill request.

## 2021-09-28 ENCOUNTER — Telehealth: Payer: Self-pay

## 2021-09-28 NOTE — Telephone Encounter (Signed)
Contact pharmacy spoke to George Rollins confirmed pt picked up 30 day supply on 08/05 and has 3 refills remaining

## 2021-09-29 ENCOUNTER — Encounter: Payer: No Typology Code available for payment source | Admitting: Family

## 2022-01-05 NOTE — Progress Notes (Signed)
Erroneous encounter-disregard

## 2022-01-12 ENCOUNTER — Encounter: Payer: Self-pay | Admitting: Family

## 2022-01-12 DIAGNOSIS — I10 Essential (primary) hypertension: Secondary | ICD-10-CM

## 2022-07-14 ENCOUNTER — Emergency Department (HOSPITAL_BASED_OUTPATIENT_CLINIC_OR_DEPARTMENT_OTHER): Payer: No Typology Code available for payment source

## 2022-07-14 ENCOUNTER — Other Ambulatory Visit: Payer: Self-pay

## 2022-07-14 ENCOUNTER — Emergency Department (HOSPITAL_BASED_OUTPATIENT_CLINIC_OR_DEPARTMENT_OTHER)
Admission: EM | Admit: 2022-07-14 | Discharge: 2022-07-14 | Disposition: A | Discharge status: Home / Self Care | Payer: No Typology Code available for payment source | Attending: Emergency Medicine | Admitting: Emergency Medicine

## 2022-07-14 DIAGNOSIS — M25571 Pain in right ankle and joints of right foot: Secondary | ICD-10-CM | POA: Insufficient documentation

## 2022-07-14 DIAGNOSIS — M79671 Pain in right foot: Secondary | ICD-10-CM

## 2022-07-14 MED ORDER — NAPROXEN 375 MG PO TABS: 375.0000 mg | ORAL_TABLET | Freq: Two times a day (BID) | ORAL | 0 refills | Status: AC

## 2022-07-14 NOTE — ED Provider Notes (Signed)
[facility] [facility] AT [facility] Provider Note   CSN: [id number] Arrival date & time: 07/14/22  1912     History  Chief Complaint  Patient presents with   Ankle Pain    Right    Blade Okula is a 37 y.o. male.  37 y.o male with a PMH of RIGHT ankle fixation presents to the ED with a chief complaint of right ankle pain that is been ongoing for "a minute ".  Patient reports he had surgery in 2015 due to his ankle fracture.  He reports pain along the medial aspect especially with ambulation along with weightbearing.  Patient is flat-footed, does not wear any inserts or arches at this time.  He has not followed up with any specialist.  Denies any other complaint, or worsening trauma.  The history is provided by the patient.  Ankle Pain Associated symptoms: no fever        Home Medications Prior to Admission medications   Medication Sig Start Date End Date Taking? Authorizing Provider  naproxen (NAPROSYN) 375 MG tablet Take 1 tablet (375 mg total) by mouth 2 (two) times daily for 7 days. 07/14/22 07/21/22 Yes Daylin Eads, Leonie Douglas, PA-C  albuterol (VENTOLIN HFA) 108 (90 Base) MCG/ACT inhaler Inhale 2 puffs into the lungs every 6 (six) hours as needed for wheezing or shortness of breath. 08/28/21   Rema Fendt, NP  valsartan (DIOVAN) 40 MG tablet Take 1 tablet (40 mg total) by mouth daily. 09/11/21 01/09/22  Rema Fendt, NP  metoprolol tartrate (LOPRESSOR) 25 MG tablet Take 1 tablet (25 mg total) by mouth 2 (two) times daily. Patient not taking: Reported on 03/30/2019 03/24/18 03/30/19  Arnaldo Natal, MD      Allergies    Fruit & vegetable daily [nutritional supplements]    Review of Systems   Review of Systems  Constitutional:  Negative for fever.  Musculoskeletal:  Positive for arthralgias.    Physical Exam Updated Vital Signs BP (!) 139/100   Pulse 97   Temp 97.8 F (36.6 C) (Temporal)   Resp 16   Ht 5\' 11"  (1.803 m)   Wt 104.3 kg   SpO2  100%   BMI 32.08 kg/m  Physical Exam Vitals and nursing note reviewed.  Constitutional:      Appearance: Normal appearance.  HENT:     Head: Normocephalic and atraumatic.     Mouth/Throat:     Mouth: Mucous membranes are moist.  Cardiovascular:     Rate and Rhythm: Normal rate.     Pulses:          Dorsalis pedis pulses are 2+ on the right side.       Posterior tibial pulses are 2+ on the right side.  Pulmonary:     Effort: Pulmonary effort is normal.  Abdominal:     General: Abdomen is flat.  Musculoskeletal:     Cervical back: Normal range of motion and neck supple.  Feet:     Right foot:     Skin integrity: Skin integrity normal. No ulcer, blister, skin breakdown, erythema, warmth, dry skin or fissure.     Comments: 2+ DP, PT pulses.  Normal skin integrity.  No pain with palpation along the lateral or medial malleolus.  Pain along the arch of the right foot. Skin:    General: Skin is warm and dry.  Neurological:     Mental Status: He is alert and oriented to person, place, and time.  ED Results / Procedures / Treatments   Labs (all labs ordered are listed, but only abnormal results are displayed) Labs Reviewed - No data to display  EKG None  Radiology DG Foot Complete Right  Result Date: 07/14/2022 CLINICAL DATA:  Foot pain EXAM: RIGHT FOOT COMPLETE - 3+ VIEW COMPARISON:  06/22/2008 FINDINGS: No fracture or malalignment. Surgical plate and fixating screws in the distal fibula. Suspect chronic fracture deformity at the medial aspect of the talus. IMPRESSION: 1. No acute osseous abnormality. Chronic appearing fracture deformity at the medial talus 2. Surgical hardware in the distal fibula. Electronically Signed   By: Jasmine Pang M.D.   On: 07/14/2022 21:40    Procedures Procedures    Medications Ordered in ED Medications - No data to display  ED Course/ Medical Decision Making/ A&P                             Medical Decision Making Amount and/or  Complexity of Data Reviewed Radiology: ordered.   Patient presents to the ED with a chief complaint of right ankle pain has been ongoing for "a minute ", previously had surgery to this ankle in 2015 by Dr. Victorino Dike according to medical records.  Pain happens to be more so along the arch, especially with weightbearing.  No pain along the surgical site, although there is a palpable hardware on my exam, however he reports no pain.  He states that he wanted to be checked out as his wife recommended he do so.  He has not had any falls, no fever, no other complaints.  No calf tenderness.  No changes in the skin to suggest cellulitis.  Xray of the right foot: IMPRESSION:  1. No acute osseous abnormality. Chronic appearing fracture  deformity at the medial talus  2. Surgical hardware in the distal fibula.     Findings were discussed with patient, we discussed a short course of anti-inflammatories to help with pain control.  He will need to follow-up with Dr. Victorino Dike for further problems with his right foot.  He is hemodynamically stable for discharge.  Portions of this note were generated with Scientist, clinical (histocompatibility and immunogenetics). Dictation errors may occur despite best attempts at proofreading.   Final Clinical Impression(s) / ED Diagnoses Final diagnoses:  Right foot pain    Rx / DC Orders ED Discharge Orders          Ordered    naproxen (NAPROSYN) 375 MG tablet  2 times daily        07/14/22 2149              Claude Manges, PA-C 07/14/22 2150    Gwyneth Sprout, MD 07/15/22 1643

## 2022-07-14 NOTE — Discharge Instructions (Addendum)
You were given a prescription for anti-inflammatories, please take 1 tablet twice a day for the next 7 days.  In addition, you may benefit from some inserts to help with weightbearing.  Please follow-up with Dr. Victorino Dike for further management of your right ankle pain.

## 2022-07-14 NOTE — ED Notes (Signed)
RN reviewed discharge instructions with pt. Pt verbalized understanding and had no further questions. VSS upon discharge.  

## 2022-07-14 NOTE — ED Triage Notes (Signed)
2015 Ankle fracture with repair. Over past several weeks ankle pain has been worsening. Feels protrusion along scar-fearful this is a piece of hardware out of place. Denies recent trauma. Drives long hours for work. Pulses intact, some tenderness, minor swelling. Weight bearing with limp.

## 2022-07-14 NOTE — ED Notes (Signed)
Pt encouraged by this RN to take missed BP meds upon departure

## 2023-04-04 ENCOUNTER — Other Ambulatory Visit: Payer: Self-pay

## 2023-04-04 ENCOUNTER — Emergency Department (HOSPITAL_BASED_OUTPATIENT_CLINIC_OR_DEPARTMENT_OTHER)
Admission: EM | Admit: 2023-04-04 | Discharge: 2023-04-04 | Discharge status: Against Medical Advice | Payer: No Typology Code available for payment source | Attending: Emergency Medicine | Admitting: Emergency Medicine

## 2023-04-04 ENCOUNTER — Encounter (HOSPITAL_BASED_OUTPATIENT_CLINIC_OR_DEPARTMENT_OTHER): Payer: Self-pay | Admitting: Emergency Medicine

## 2023-04-04 DIAGNOSIS — R220 Localized swelling, mass and lump, head: Secondary | ICD-10-CM | POA: Insufficient documentation

## 2023-04-04 DIAGNOSIS — Z5321 Procedure and treatment not carried out due to patient leaving prior to being seen by health care provider: Secondary | ICD-10-CM | POA: Insufficient documentation

## 2023-04-04 HISTORY — DX: Unspecified asthma, uncomplicated: J45.909

## 2023-04-04 NOTE — ED Triage Notes (Signed)
"  Bump" on back of head Noticed a few days ago, tender to touch "Probably an ingrown hair or something"

## 2023-04-11 ENCOUNTER — Emergency Department (HOSPITAL_BASED_OUTPATIENT_CLINIC_OR_DEPARTMENT_OTHER)
Admission: EM | Admit: 2023-04-11 | Discharge: 2023-04-11 | Disposition: A | Discharge status: Home / Self Care | Payer: No Typology Code available for payment source | Attending: Emergency Medicine | Admitting: Emergency Medicine

## 2023-04-11 ENCOUNTER — Other Ambulatory Visit: Payer: Self-pay

## 2023-04-11 ENCOUNTER — Encounter (HOSPITAL_BASED_OUTPATIENT_CLINIC_OR_DEPARTMENT_OTHER): Payer: Self-pay | Admitting: *Deleted

## 2023-04-11 ENCOUNTER — Other Ambulatory Visit (HOSPITAL_BASED_OUTPATIENT_CLINIC_OR_DEPARTMENT_OTHER): Payer: Self-pay

## 2023-04-11 DIAGNOSIS — J340 Abscess, furuncle and carbuncle of nose: Secondary | ICD-10-CM | POA: Insufficient documentation

## 2023-04-11 DIAGNOSIS — Z79899 Other long term (current) drug therapy: Secondary | ICD-10-CM | POA: Insufficient documentation

## 2023-04-11 DIAGNOSIS — L0291 Cutaneous abscess, unspecified: Secondary | ICD-10-CM

## 2023-04-11 DIAGNOSIS — L02811 Cutaneous abscess of head [any part, except face]: Secondary | ICD-10-CM | POA: Insufficient documentation

## 2023-04-11 DIAGNOSIS — I10 Essential (primary) hypertension: Secondary | ICD-10-CM | POA: Insufficient documentation

## 2023-04-11 LAB — CBC
HCT: 43.6 % (ref 39.0–52.0)
Hemoglobin: 14.6 g/dL (ref 13.0–17.0)
MCH: 30.3 pg (ref 26.0–34.0)
MCHC: 33.5 g/dL (ref 30.0–36.0)
MCV: 90.5 fL (ref 80.0–100.0)
Platelets: 280 10*3/uL (ref 150–400)
RBC: 4.82 MIL/uL (ref 4.22–5.81)
RDW: 13.5 % (ref 11.5–15.5)
WBC: 7.8 10*3/uL (ref 4.0–10.5)
nRBC: 0 % (ref 0.0–0.2)

## 2023-04-11 LAB — BASIC METABOLIC PANEL
Anion gap: 6 (ref 5–15)
BUN: 9 mg/dL (ref 6–20)
CO2: 28 mmol/L (ref 22–32)
Calcium: 9.3 mg/dL (ref 8.9–10.3)
Chloride: 106 mmol/L (ref 98–111)
Creatinine, Ser: 0.86 mg/dL (ref 0.61–1.24)
GFR, Estimated: >60 mL/min (ref >60)
Glucose, Bld: 76 mg/dL (ref 70–99)
Potassium: 4 mmol/L (ref 3.5–5.1)
Sodium: 140 mmol/L (ref 135–145)

## 2023-04-11 MED ORDER — VALSARTAN 40 MG PO TABS
40.0000 mg | ORAL_TABLET | Freq: Every day | ORAL | 5 refills | Status: DC
  Filled 2023-04-11 – 2023-04-23 (×3): qty 30, 30d supply, fill #0

## 2023-04-11 MED ORDER — DOXYCYCLINE HYCLATE 100 MG PO CAPS
100.0000 mg | ORAL_CAPSULE | Freq: Two times a day (BID) | ORAL | 0 refills | Status: DC
  Filled 2023-04-11: qty 14, 7d supply, fill #0

## 2023-04-11 MED ORDER — DOXYCYCLINE HYCLATE 100 MG PO TABS
100.0000 mg | ORAL_TABLET | Freq: Once | ORAL | Status: AC
  Administered 2023-04-11: 100 mg via ORAL
  Filled 2023-04-11: qty 1

## 2023-04-11 NOTE — Discharge Instructions (Signed)

## 2023-04-11 NOTE — ED Provider Notes (Signed)
[place] [facility]ERGENCY DEPARTMENT AT [facility] Provider Note   CSN: [id number] Arrival date & time: 04/11/23  1413     History  Chief Complaint  Patient presents with   Recurrent Skin Infections    George Rollins is a 38 y.o. male.  Patient is a 38 year old male with a history of hypertension who is presenting today with complaints of abscesses 1 in his head that has been present for the last 2 weeks and then 1 on the septum of his nose that has been present for the last few days.  He reports the 1 in his head is started draining and it is starting to get a little better but the 1 on his nose is gradually getting worse.  He denies any systemic symptoms such as fever, chills or malaise.  Denies history of recurrent abscesses. Also reports he has been out of his blood pressure medication for several years and he needs to get back on it and he has noticed that he bruises easily.  No recent viral illness.  No history of aspirin use or anticoagulants.  He is not currently taking any medications.  The history is provided by the patient.       Home Medications Prior to Admission medications   Medication Sig Start Date End Date Taking? Authorizing Provider  doxycycline (VIBRAMYCIN) 100 MG capsule Take 1 capsule (100 mg total) by mouth 2 (two) times daily. 04/11/23  Yes Rithika Seel, Alphonzo Lemmings, MD  albuterol (VENTOLIN HFA) 108 (90 Base) MCG/ACT inhaler Inhale 2 puffs into the lungs every 6 (six) hours as needed for wheezing or shortness of breath. 08/28/21   Rema Fendt, NP  valsartan (DIOVAN) 40 MG tablet Take 1 tablet (40 mg total) by mouth daily. 04/11/23 08/09/23  Gwyneth Sprout, MD  metoprolol tartrate (LOPRESSOR) 25 MG tablet Take 1 tablet (25 mg total) by mouth 2 (two) times daily. Patient not taking: Reported on 03/30/2019 03/24/18 03/30/19  Arnaldo Natal, MD      Allergies    Fruit & vegetable daily [nutritional supplements]    Review of Systems   Review of  Systems  Physical Exam Updated Vital Signs BP (!) 146/108   Pulse 96   Temp 98.4 F (36.9 C) (Oral)   Resp 18   SpO2 100%  Physical Exam Vitals and nursing note reviewed.  Constitutional:      General: He is not in acute distress.    Appearance: He is well-developed.  HENT:     Head: Normocephalic and atraumatic.      Nose:     Comments: Abscess present on the external septum of the nose with pointing, erythema and swelling Eyes:     Conjunctiva/sclera: Conjunctivae normal.     Pupils: Pupils are equal, round, and reactive to light.  Cardiovascular:     Rate and Rhythm: Normal rate and regular rhythm.     Heart sounds: No murmur heard. Pulmonary:     Effort: Pulmonary effort is normal. No respiratory distress.     Breath sounds: Normal breath sounds. No wheezing or rales.  Musculoskeletal:        General: No tenderness. Normal range of motion.     Cervical back: Normal range of motion and neck supple.  Skin:    General: Skin is warm and dry.     Findings: No erythema or rash.     Comments: No evidence of purpura, bruising or petechia at this time  Neurological:     Mental  Status: He is alert and oriented to person, place, and time.  Psychiatric:        Behavior: Behavior normal.     ED Results / Procedures / Treatments   Labs (all labs ordered are listed, but only abnormal results are displayed) Labs Reviewed  CBC  BASIC METABOLIC PANEL    EKG None  Radiology No results found.  Procedures Procedures    Medications Ordered in ED Medications  doxycycline (VIBRA-TABS) tablet 100 mg (has no administration in time range)    ED Course/ Medical Decision Making/ A&P                                 Medical Decision Making Amount and/or Complexity of Data Reviewed Labs: ordered. Decision-making details documented in ED Course.  Risk Prescription drug management.   Patient presenting today with symptoms consistent with multiple abscesses.  The  abscess in the scalp is draining and improving but a new and has arisen on his nose.  No indication for drainage at this time but patient does need to start on antibiotics.  Also complaining of easy bruising and being off blood pressure medicine for several years.  Has not had blood work done in at least 4 years.  Will ensure normal renal function and start him back on his valsartan.  Will also ensure no evidence of platelet dysfunction that might cause bruising.  He is not on any medications that increase risk of bleeding.  4:20 PM I independently interpreted patient's labs and CBC and BMP both within normal limits.  No evidence of platelet dysfunction.  Will start patient back on his valsartan and get a referral for PCP.  Patient started on doxycycline for abscesses.        Final Clinical Impression(s) / ED Diagnoses Final diagnoses:  Abscess  Uncontrolled hypertension    Rx / DC Orders ED Discharge Orders          Ordered    valsartan (DIOVAN) 40 MG tablet  Daily        04/11/23 1618    doxycycline (VIBRAMYCIN) 100 MG capsule  2 times daily        04/11/23 1618    Referral to [facility] Care Management       Comments: Pharmacy Medication Management for Uncontrolled hypertension in the ED   04/11/23 1619              Gwyneth Sprout, MD 04/11/23 1620

## 2023-04-11 NOTE — ED Triage Notes (Signed)
Pt is here for evaluation of bump on back of his head which is draining.  He also has one on his nose now.  Pt denies any body aches or chills with this.  Came last week but eloped due to wait.

## 2023-04-12 ENCOUNTER — Other Ambulatory Visit: Payer: Self-pay

## 2023-04-20 ENCOUNTER — Telehealth: Payer: Self-pay

## 2023-04-20 NOTE — Progress Notes (Signed)
 Care Guide Pharmacy Note  04/20/2023 Name: George Rollins MRN: [id number] DOB: [birth year]  Referred By: Patient, No Pcp Per Reason for referral: Care Coordination (Outreach to schedule with pharm d )   George Rollins is a 38 y.o. year old male who is a primary care patient of Patient, No Pcp Per.  George Rollins was referred to the pharmacist for assistance related to: HTN  An unsuccessful telephone outreach was attempted today to contact the patient who was referred to the pharmacy team for assistance with medication management. Additional attempts will be made to contact the patient.  George Rollins , RMA     [facility]  [facility] Guide  Direct Dial: [phone]  Website: [email or web address]

## 2023-04-22 ENCOUNTER — Other Ambulatory Visit (HOSPITAL_BASED_OUTPATIENT_CLINIC_OR_DEPARTMENT_OTHER): Payer: Self-pay

## 2023-04-23 ENCOUNTER — Other Ambulatory Visit (HOSPITAL_BASED_OUTPATIENT_CLINIC_OR_DEPARTMENT_OTHER): Payer: Self-pay

## 2023-05-03 ENCOUNTER — Encounter: Payer: Self-pay | Admitting: Pharmacist

## 2023-05-03 ENCOUNTER — Other Ambulatory Visit (HOSPITAL_BASED_OUTPATIENT_CLINIC_OR_DEPARTMENT_OTHER): Payer: Self-pay

## 2023-05-03 ENCOUNTER — Other Ambulatory Visit: Payer: Self-pay | Admitting: Pharmacist

## 2023-05-03 DIAGNOSIS — I10 Essential (primary) hypertension: Secondary | ICD-10-CM

## 2023-05-03 MED ORDER — BLOOD PRESSURE MONITOR MISC
0 refills | Status: AC
  Filled 2023-05-03 – 2023-08-31 (×2): qty 1, 30d supply, fill #0

## 2023-05-03 NOTE — Progress Notes (Signed)
   05/03/2023 Name: George Rollins MRN: [id number] DOB: 1985/04/10  No chief complaint on file.   George Rollins is a 38 y.o. year old male who presented for a telephone visit.   They were referred to the pharmacist by their Case Management Team  for assistance in managing hypertension.    Subjective:  Care Team: Primary Care Provider: previously saw Ricky Stabs in 2023; Next Scheduled Visit: 3/24/202  Medication Access/Adherence  Current Pharmacy:  [facility] [address] [place] [address] Phone: [phone] Fax: [phone]  Patient reports affordability concerns with their medications: No  Patient reports access/transportation concerns to their pharmacy: No  Patient reports adherence concerns with their medications:  No     Hypertension:  Current medications: valsartan 40 mg daily. Last dispensed 30-day supply from [facility]  Medications previously tried: valsartan, metoprolol   Patient does not have a validated, automated, upper arm home BP cuff Current blood pressure readings readings: none provided.   Patient denies hypotensive s/sx including dizziness, lightheadedness.  Patient denies hypertensive symptoms including headache, chest pain, shortness of breath  Current meal patterns:  -Compliant with sodium restriction -No excessive caffeine intake reported   Current physical activity: none reported    Objective:  Lab Results  Component Value Date   HGBA1C 5.7 (H) 09/11/2021    Lab Results  Component Value Date   CREATININE 0.86 04/11/2023   BUN 9 04/11/2023   NA 140 04/11/2023   K 4.0 04/11/2023   CL 106 04/11/2023   CO2 28 04/11/2023    Lab Results  Component Value Date   CHOL 172 09/11/2021   HDL 51 09/11/2021   LDLCALC 98 09/11/2021   TRIG 131 09/11/2021   CHOLHDL 3.4 09/11/2021    Medications Reviewed Today   Medications were not reviewed in this  encounter      Assessment/Plan:   Hypertension: - Patient recently seen in the ED 04/11/2023. Treated for abscess but noted to be hypertensive. Admitted to the team that he has not taken BP medications in a while. - Dispense hx indicates he picked-up the valsartan. Confirms today that he's taking it. - Currently uncontrolled based on ED BP. BP now anticipated to be better controlled with valsartan. - Reviewed long term cardiovascular and renal outcomes of uncontrolled blood pressure - Reviewed appropriate blood pressure monitoring technique and reviewed goal blood pressure. Recommended to check home blood pressure and heart rate. - Recommend to keep follow-up with Ricky Stabs.    Butch Penny, PharmD, Patsy Baltimore, CPP Clinical Pharmacist [facility] [phone]

## 2023-05-13 ENCOUNTER — Other Ambulatory Visit (HOSPITAL_BASED_OUTPATIENT_CLINIC_OR_DEPARTMENT_OTHER): Payer: Self-pay

## 2023-05-16 ENCOUNTER — Encounter: Payer: No Typology Code available for payment source | Admitting: Family

## 2023-05-16 NOTE — Progress Notes (Signed)
 Erroneous encounter-disregard

## 2023-06-09 ENCOUNTER — Encounter: Payer: Self-pay | Admitting: Family

## 2023-06-09 ENCOUNTER — Ambulatory Visit (INDEPENDENT_AMBULATORY_CARE_PROVIDER_SITE_OTHER): Admitting: Family

## 2023-06-09 ENCOUNTER — Other Ambulatory Visit (HOSPITAL_COMMUNITY)
Admission: RE | Admit: 2023-06-09 | Discharge: 2023-06-09 | Disposition: A | Discharge status: Home / Self Care | Source: Ambulatory Visit | Attending: Family | Admitting: Family

## 2023-06-09 ENCOUNTER — Other Ambulatory Visit (HOSPITAL_BASED_OUTPATIENT_CLINIC_OR_DEPARTMENT_OTHER): Payer: Self-pay

## 2023-06-09 VITALS — BP 139/100 | HR 92 | Temp 98.0°F | Resp 18 | Ht 70.0 in | Wt 225.6 lb

## 2023-06-09 DIAGNOSIS — Z13228 Encounter for screening for other metabolic disorders: Secondary | ICD-10-CM | POA: Diagnosis not present

## 2023-06-09 DIAGNOSIS — F418 Other specified anxiety disorders: Secondary | ICD-10-CM

## 2023-06-09 DIAGNOSIS — I10 Essential (primary) hypertension: Secondary | ICD-10-CM | POA: Diagnosis not present

## 2023-06-09 DIAGNOSIS — R0683 Snoring: Secondary | ICD-10-CM

## 2023-06-09 DIAGNOSIS — Z13220 Encounter for screening for lipoid disorders: Secondary | ICD-10-CM

## 2023-06-09 DIAGNOSIS — Z114 Encounter for screening for human immunodeficiency virus [HIV]: Secondary | ICD-10-CM

## 2023-06-09 DIAGNOSIS — Z0000 Encounter for general adult medical examination without abnormal findings: Secondary | ICD-10-CM

## 2023-06-09 DIAGNOSIS — Z0001 Encounter for general adult medical examination with abnormal findings: Secondary | ICD-10-CM

## 2023-06-09 DIAGNOSIS — F32A Depression, unspecified: Secondary | ICD-10-CM

## 2023-06-09 DIAGNOSIS — Z1329 Encounter for screening for other suspected endocrine disorder: Secondary | ICD-10-CM

## 2023-06-09 DIAGNOSIS — Z131 Encounter for screening for diabetes mellitus: Secondary | ICD-10-CM

## 2023-06-09 DIAGNOSIS — Z113 Encounter for screening for infections with a predominantly sexual mode of transmission: Secondary | ICD-10-CM

## 2023-06-09 DIAGNOSIS — J4520 Mild intermittent asthma, uncomplicated: Secondary | ICD-10-CM | POA: Diagnosis not present

## 2023-06-09 DIAGNOSIS — J3089 Other allergic rhinitis: Secondary | ICD-10-CM

## 2023-06-09 DIAGNOSIS — Z130 Encounter for screening for diseases of the blood and blood-forming organs and certain disorders involving the immune mechanism: Secondary | ICD-10-CM | POA: Diagnosis not present

## 2023-06-09 MED ORDER — ALBUTEROL SULFATE HFA 108 (90 BASE) MCG/ACT IN AERS
2.0000 | INHALATION_SPRAY | Freq: Four times a day (QID) | RESPIRATORY_TRACT | 2 refills | Status: AC | PRN
  Filled 2023-06-09: qty 8.5, 30d supply, fill #0
  Filled 2023-08-31: qty 8.5, 30d supply, fill #1

## 2023-06-09 MED ORDER — VALSARTAN 80 MG PO TABS: 80.0000 mg | ORAL_TABLET | Freq: Every day | ORAL | 0 refills | Status: AC

## 2023-06-09 MED ORDER — FLUTICASONE PROPIONATE 50 MCG/ACT NA SUSP
2.0000 | Freq: Every day | NASAL | 2 refills | Status: AC
  Filled 2023-06-09: qty 16, 30d supply, fill #0
  Filled 2023-08-31: qty 16, 30d supply, fill #1

## 2023-06-09 NOTE — Progress Notes (Signed)
 Memory bad, restless put in for another sleep study, congestion is bad over the counter medication not working, thinks he need a stronger pus in nose b/p medication needs a new inhaler, bruising on his body.

## 2023-06-09 NOTE — Progress Notes (Signed)
 Patient ID: George Rollins, male    DOB: May 27, 1985  MRN: [id number]  CC: Annual Exam   Subjective: George Rollins is a 38 y.o. male who presents for annual exam.   His concerns today include:  - Doing well on Valsartan, no issues/concerns. He does not complain of red flag symptoms such as but not limited to chest pain, shortness of breath, worst headache of life, nausea/vomiting.  - Anxiety depression affecting memory. States related to work-life balance. States he wants to know his purpose in life. He denies thoughts of self-harm, suicidal ideations, homicidal ideations. - Doing well on Albuterol inhaler, no issues/concerns. - Allergies causing nasal congestion. States all over-the-counter medications do not help. - Routine STD test. Denies recent exposure/symptoms. - Requests sleep study.  Patient Active Problem List   Diagnosis Date Noted   Prediabetes 09/12/2021     Current Outpatient Medications on File Prior to Visit  Medication Sig Dispense Refill   Blood Pressure Monitor MISC Use as directed. (Patient not taking: Reported on 06/09/2023) 1 each 0   doxycycline (VIBRAMYCIN) 100 MG capsule Take 1 capsule (100 mg total) by mouth 2 (two) times daily. (Patient not taking: Reported on 06/09/2023) 14 capsule 0   [DISCONTINUED] metoprolol tartrate (LOPRESSOR) 25 MG tablet Take 1 tablet (25 mg total) by mouth 2 (two) times daily. (Patient not taking: Reported on 03/30/2019) 60 tablet 1   No current facility-administered medications on file prior to visit.    Allergies  Allergen Reactions   Fruit & Vegetable Daily [Nutritional Supplements]     freshf ruit    Social History   Socioeconomic History   Marital status: Married    Spouse name: Not on file   Number of children: Not on file   Years of education: Not on file   Highest education level: Not on file  Occupational History   Not on file  Tobacco Use   Smoking status: Former    Types: Cigarettes    Passive  exposure: Past   Smokeless tobacco: Never  Vaping Use   Vaping status: Never Used  Substance and Sexual Activity   Alcohol use: Yes    Comment: Regularly    Drug use: No   Sexual activity: Yes  Other Topics Concern   Not on file  Social History Narrative   ** Merged History Encounter **       Social Drivers of Health   Financial Resource Strain: Low Risk  (06/09/2023)   Overall Financial Resource Strain (CARDIA)    Difficulty of Paying Living Expenses: Not hard at all  Food Insecurity: No Food Insecurity (06/09/2023)   Hunger Vital Sign    Worried About Running Out of Food in the Last Year: Never true    Ran Out of Food in the Last Year: Never true  Transportation Needs: No Transportation Needs (06/09/2023)   PRAPARE - Administrator, Civil Service (Medical): No    Lack of Transportation (Non-Medical): No  Physical Activity: Sufficiently Active (06/09/2023)   Exercise Vital Sign    Days of Exercise per Week: 7 days    Minutes of Exercise per Session: 30 min  Stress: Stress Concern Present (06/09/2023)   [employer] of Occupational Health - Occupational Stress Questionnaire    Feeling of Stress : Very much  Social Connections: Moderately Isolated (06/09/2023)   Social Connection and Isolation Panel [NHANES]    Frequency of Communication with Friends and Family: More than three times a week  Frequency of Social Gatherings with Friends and Family: Twice a week    Attends Religious Services: Never    Database administrator or Organizations: No    Attends Banker Meetings: Never    Marital Status: Married  Catering manager Violence: Not At Risk (06/09/2023)   Humiliation, Afraid, Rape, and Kick questionnaire    Fear of Current or Ex-Partner: No    Emotionally Abused: No    Physically Abused: No    Sexually Abused: No    Family History  Problem Relation Age of Onset   Cancer Mother    Breast cancer Maternal Grandfather     Past Surgical  History:  Procedure Laterality Date   HERNIA REPAIR     ORIF ANKLE FRACTURE Right 08/28/2015   Procedure: OPEN REDUCTION INTERNAL FIXATION (ORIF)RIGHT  ANKLE LATERAL MALLEOUS FRACTURE ;  Surgeon: Toni Arthurs, MD;  Location: [facility];  Service: Orthopedics;  Laterality: Right;    ROS: Review of Systems Negative except as stated above  PHYSICAL EXAM: BP (!) 139/100   Pulse 92   Temp 98 F (36.7 C) (Oral)   Resp 18   Ht 5\' 10"  (1.778 m)   Wt 225 lb 9.6 oz (102.3 kg)   SpO2 96%   BMI 32.37 kg/m   Physical Exam HENT:     Head: Normocephalic and atraumatic.     Right Ear: Tympanic membrane, ear canal and external ear normal.     Left Ear: Tympanic membrane, ear canal and external ear normal.     Nose: Nose normal.     Mouth/Throat:     Mouth: Mucous membranes are moist.     Pharynx: Oropharynx is clear.  Eyes:     Extraocular Movements: Extraocular movements intact.     Conjunctiva/sclera: Conjunctivae normal.     Pupils: Pupils are equal, round, and reactive to light.  Neck:     Thyroid: No thyroid mass, thyromegaly or thyroid tenderness.  Cardiovascular:     Rate and Rhythm: Normal rate and regular rhythm.     Pulses: Normal pulses.     Heart sounds: Normal heart sounds.  Pulmonary:     Effort: Pulmonary effort is normal.     Breath sounds: Normal breath sounds.  Abdominal:     General: Bowel sounds are normal.     Palpations: Abdomen is soft.  Genitourinary:    Comments: Patient declined. Musculoskeletal:        General: Normal range of motion.     Right shoulder: Normal.     Left shoulder: Normal.     Right upper arm: Normal.     Left upper arm: Normal.     Right elbow: Normal.     Left elbow: Normal.     Right forearm: Normal.     Left forearm: Normal.     Right wrist: Normal.     Left wrist: Normal.     Right hand: Normal.     Left hand: Normal.     Cervical back: Normal, normal range of motion and neck supple.     Thoracic back: Normal.      Lumbar back: Normal.     Right hip: Normal.     Left hip: Normal.     Right upper leg: Normal.     Left upper leg: Normal.     Right knee: Normal.     Left knee: Normal.     Right lower leg: Normal.     Left  lower leg: Normal.     Right ankle: Normal.     Left ankle: Normal.     Right foot: Normal.     Left foot: Normal.  Skin:    General: Skin is warm and dry.     Capillary Refill: Capillary refill takes less than 2 seconds.  Neurological:     General: No focal deficit present.     Mental Status: He is alert and oriented to person, place, and time.  Psychiatric:        Mood and Affect: Mood normal.        Behavior: Behavior normal.     ASSESSMENT AND PLAN: 1. Annual physical exam (Primary) - Counseled on 150 minutes of exercise per week as tolerated, healthy eating (including decreased daily intake of saturated fats, cholesterol, added sugars, sodium), STI prevention, and routine healthcare maintenance.  2. Screening for metabolic disorder - Routine screening.  - CMP14+EGFR  3. Screening for deficiency anemia - Routine screening.  - CBC  4. Diabetes mellitus screening - Routine screening.  - Hemoglobin A1c  5. Screening cholesterol level - Routine screening.  - Lipid panel  6. Screening for thyroid disorder - Routine screening.  - TSH  7. Routine screening for STI (sexually transmitted infection) - Routine screening.  - Urine cytology ancillary only  8. Encounter for screening for HIV - Routine screening.  - HIV antibody (with reflex)  9. Primary hypertension - Blood pressure not at goal during today's visit. Patient asymptomatic without chest pressure, chest pain, palpitations, shortness of breath, worst headache of life, and any additional red flag symptoms. - Increase Valsartan from 40 mg to 80 mg as prescribed.  - Counseled on blood pressure goal of less than 130/80, low-sodium, DASH diet, medication compliance, and 150 minutes of moderate  intensity exercise per week as tolerated. Counseled on medication adherence and adverse effects. - Follow-up with primary provider in 4 weeks or sooner if needed.  - valsartan (DIOVAN) 80 MG tablet; Take 1 tablet (80 mg total) by mouth daily.  Dispense: 90 tablet; Refill: 0  10. Anxiety and depression - Patient denies thoughts of self-harm, suicidal ideations, homicidal ideations. - Patient declined pharmacological therapy.  - Patient declined referral to Psychiatry.  - Follow-up with primary provider as scheduled.  11. Mild intermittent asthma, unspecified whether complicated - Continue Albuterol inhaler as prescribed. Counseled on medication adherence/adverse effects.  - Follow-up with primary provider as scheduled. - albuterol (VENTOLIN HFA) 108 (90 Base) MCG/ACT inhaler; Inhale 2 puffs into the lungs every 6 (six) hours as needed for wheezing or shortness of breath.  Dispense: 8.5 g; Refill: 2  12. Perennial allergic rhinitis - Fluticasone nasal spray as prescribed. Counseled on medication adherence/adverse effects.  - Referral to Allergy for evaluation/management.  - Follow-up with primary provider as scheduled. - fluticasone (FLONASE) 50 MCG/ACT nasal spray; Place 2 sprays into both nostrils daily.  Dispense: 16 g; Refill: 2 - Ambulatory referral to Allergy  13. Snoring - PSG Sleep Study for evaluation. - PSG Sleep Study; Future   Patient was given the opportunity to ask questions.  Patient verbalized understanding of the plan and was able to repeat key elements of the plan. Patient was given clear instructions to go to Emergency Department or return to medical center if symptoms don't improve, worsen, or new problems develop.The patient verbalized understanding.   Orders Placed This Encounter  Procedures   CBC   Lipid panel   CMP14+EGFR   Hemoglobin A1c   TSH  HIV antibody (with reflex)   Ambulatory referral to Allergy   PSG Sleep Study     Requested Prescriptions    Signed Prescriptions Disp Refills   fluticasone (FLONASE) 50 MCG/ACT nasal spray 16 g 2    Sig: Place 2 sprays into both nostrils daily.   valsartan (DIOVAN) 80 MG tablet 90 tablet 0    Sig: Take 1 tablet (80 mg total) by mouth daily.   albuterol (VENTOLIN HFA) 108 (90 Base) MCG/ACT inhaler 8.5 g 2    Sig: Inhale 2 puffs into the lungs every 6 (six) hours as needed for wheezing or shortness of breath.    Return in about 1 year (around 06/08/2024) for Physical per patient preference.  Senaida Dama, NP

## 2023-06-10 LAB — URINE CYTOLOGY ANCILLARY ONLY
Chlamydia: NEGATIVE
Comment: NEGATIVE
Comment: NEGATIVE
Comment: NORMAL
Neisseria Gonorrhea: NEGATIVE
Trichomonas: NEGATIVE

## 2023-06-10 LAB — HEMOGLOBIN A1C
Est. average glucose Bld gHb Est-mCnc: 123 mg/dL
Hgb A1c MFr Bld: 5.9 % — ABNORMAL HIGH (ref 4.8–5.6)

## 2023-06-10 LAB — CBC
Hematocrit: 48.5 % (ref 37.5–51.0)
Hemoglobin: 16 g/dL (ref 13.0–17.7)
MCH: 30.4 pg (ref 26.6–33.0)
MCHC: 33 g/dL (ref 31.5–35.7)
MCV: 92 fL (ref 79–97)
Platelets: 299 10*3/uL (ref 150–450)
RBC: 5.26 x10E6/uL (ref 4.14–5.80)
RDW: 13.3 % (ref 11.6–15.4)
WBC: 5.9 10*3/uL (ref 3.4–10.8)

## 2023-06-10 LAB — CMP14+EGFR
ALT: 24 IU/L (ref 0–44)
AST: 19 IU/L (ref 0–40)
Albumin: 4.5 g/dL (ref 4.1–5.1)
Alkaline Phosphatase: 105 IU/L (ref 44–121)
BUN/Creatinine Ratio: 7 — ABNORMAL LOW (ref 9–20)
BUN: 7 mg/dL (ref 6–20)
Bilirubin Total: 0.6 mg/dL (ref 0.0–1.2)
CO2: 22 mmol/L (ref 20–29)
Calcium: 9.2 mg/dL (ref 8.7–10.2)
Chloride: 102 mmol/L (ref 96–106)
Creatinine, Ser: 1.02 mg/dL (ref 0.76–1.27)
Globulin, Total: 3 g/dL (ref 1.5–4.5)
Glucose: 95 mg/dL (ref 70–99)
Potassium: 4.5 mmol/L (ref 3.5–5.2)
Sodium: 142 mmol/L (ref 134–144)
Total Protein: 7.5 g/dL (ref 6.0–8.5)
eGFR: 96 mL/min/{1.73_m2} (ref >59)

## 2023-06-10 LAB — LIPID PANEL
Chol/HDL Ratio: 3.2 ratio (ref 0.0–5.0)
Cholesterol, Total: 187 mg/dL (ref 100–199)
HDL: 58 mg/dL (ref >39)
LDL Chol Calc (NIH): 105 mg/dL — ABNORMAL HIGH (ref 0–99)
Triglycerides: 134 mg/dL (ref 0–149)
VLDL Cholesterol Cal: 24 mg/dL (ref 5–40)

## 2023-06-10 LAB — HIV ANTIBODY (ROUTINE TESTING W REFLEX): HIV Screen 4th Generation wRfx: NONREACTIVE

## 2023-06-10 LAB — TSH: TSH: 0.827 u[IU]/mL (ref 0.450–4.500)

## 2023-06-14 ENCOUNTER — Encounter: Payer: Self-pay | Admitting: Family

## 2023-06-14 ENCOUNTER — Other Ambulatory Visit: Payer: Self-pay | Admitting: Family

## 2023-06-14 ENCOUNTER — Telehealth: Payer: Self-pay | Admitting: Emergency Medicine

## 2023-06-14 ENCOUNTER — Ambulatory Visit: Payer: Self-pay

## 2023-06-14 DIAGNOSIS — Z13220 Encounter for screening for lipoid disorders: Secondary | ICD-10-CM

## 2023-06-14 NOTE — Telephone Encounter (Signed)
 I spoke with patient and made them aware of their lab results and recommendations per PCP.  Patient verbalized understanding

## 2023-06-14 NOTE — Telephone Encounter (Signed)
 This RN returned patient call about lab work. Patient states he does not have any further questions after reviewing labwork and information note in MyChart. Patient will call back in roughly 4 weeks to schedule fasting cholesterol lab work. Patient also asked if there is an update on sleep study, stating he does know that they are supposed to call him but he has not heard from them yet.   Copied from CRM [id number]. Topic: Clinical - Lab/Test Results >> Jun 14, 2023 11:12 AM Zina Hilts wrote: Reason for CRM: Patient returning phone call from nurse regarding his lab results

## 2023-08-05 ENCOUNTER — Other Ambulatory Visit: Payer: Self-pay | Admitting: Family

## 2023-08-05 ENCOUNTER — Other Ambulatory Visit (HOSPITAL_BASED_OUTPATIENT_CLINIC_OR_DEPARTMENT_OTHER): Payer: Self-pay

## 2023-08-05 DIAGNOSIS — I10 Essential (primary) hypertension: Secondary | ICD-10-CM

## 2023-08-06 ENCOUNTER — Other Ambulatory Visit: Payer: Self-pay | Admitting: Family

## 2023-08-06 ENCOUNTER — Other Ambulatory Visit (HOSPITAL_BASED_OUTPATIENT_CLINIC_OR_DEPARTMENT_OTHER): Payer: Self-pay

## 2023-08-06 DIAGNOSIS — I10 Essential (primary) hypertension: Secondary | ICD-10-CM

## 2023-08-08 ENCOUNTER — Other Ambulatory Visit (HOSPITAL_BASED_OUTPATIENT_CLINIC_OR_DEPARTMENT_OTHER): Payer: Self-pay

## 2023-08-08 NOTE — Telephone Encounter (Signed)
 On 80 mg now, will refuse this request.  Requested Prescriptions  Pending Prescriptions Disp Refills   valsartan  (DIOVAN ) 40 MG tablet 30 tablet 5    Sig: Take 1 tablet (40 mg total) by mouth daily.     Cardiovascular:  Angiotensin Receptor Blockers Failed - 08/08/2023  3:06 PM      Failed - Last BP in normal range    BP Readings from Last 1 Encounters:  06/09/23 (!) 139/100         Failed - Valid encounter within last 6 months    Recent Outpatient Visits           2 months ago Annual physical exam   [facility], [place], NP   1 year ago Annual physical exam   [facility], Amy J, NP   1 year ago Encounter to establish care   [facility], Amy J, NP              Passed - Cr in normal range and within 180 days    Creatinine, Ser  Date Value Ref Range Status  06/09/2023 1.02 0.76 - 1.27 mg/dL Final         Passed - K in normal range and within 180 days    Potassium  Date Value Ref Range Status  06/09/2023 4.5 3.5 - 5.2 mmol/L Final         Passed - Patient is not pregnant

## 2023-08-23 ENCOUNTER — Ambulatory Visit (HOSPITAL_BASED_OUTPATIENT_CLINIC_OR_DEPARTMENT_OTHER): Attending: Family | Admitting: Internal Medicine

## 2023-08-23 VITALS — Ht 71.0 in | Wt 215.0 lb

## 2023-08-23 DIAGNOSIS — G4733 Obstructive sleep apnea (adult) (pediatric): Secondary | ICD-10-CM | POA: Diagnosis not present

## 2023-08-23 DIAGNOSIS — R0683 Snoring: Secondary | ICD-10-CM | POA: Insufficient documentation

## 2023-08-28 NOTE — Procedures (Signed)
 Darryle Law [facility] [address] [place], [place] [address] Tel: [phone]   Fax: [phone]  Polysomnography Interpretation  Patient Name:  George Rollins, George Rollins Date:  08/23/2023 Referring Physician:  Greig Drones, NP  Indications for Polysomnography The patient is a 38 year old Male who is 5' 11 and weighs 215.0 lbs. His BMI equals 30.1.  A full night polysomnogram was performed to evaluate for Obstructive Sleep Apnea.  No medications were reported taken.  No Data.   Polysomnogram Data A full night polysomnogram recorded the standard physiologic parameters including EEG, EOG, EMG, EKG, nasal and oral airflow.  Respiratory parameters of chest and abdominal movements were recorded with Respiratory Inductance Plethysmography belts.  Oxygen saturation was recorded by pulse oximetry.   Sleep Architecture The total recording time of the polysomnogram was 380.8 minutes.  The total sleep time was 339.5 minutes.  The patient spent 10.5% of total sleep time in Stage N1, 66.1% in Stage N2, 0.0% in Stages N3, and 23.4% in REM.  Sleep latency was 20.4 minutes.  REM latency was 53.0 minutes.  Sleep Efficiency was 89.2%.  Wake after Sleep Onset time was 21.0 minutes.  Respiratory Events The polysomnogram revealed a presence of 1 obstructive 0 central, and 0 mixed apneas resulting in an Apnea index of 0.2 events per hour.  There were 60 hypopneas (>=3% desaturation and/or arousal) resulting in an Apnea\Hypopnea Index (AHI >=3% desaturation and/or arousal) of 10.8 events per hour.  There were 31 hypopneas (>=4% desaturation) resulting in an Apnea\Hypopnea Index (AHI >=4% desaturation) of 5.7 events per hour.  There were 34 Respiratory Effort Related Arousals resulting in a RERA index of 6.0 events per hour. The Respiratory Disturbance Index is 16.8 events per hour.  The snore index was 0 events per hour.  Mean oxygen saturation was 95.4%.  The lowest oxygen  saturation during sleep was 86.0%.  Time spent <=88% oxygen saturation was 0.7 minutes (0.2%).  Limb Activity There were 0 total limb movements recorded, of this total.  Cardiac Summary The average pulse rate was 86.9 bpm.  The minimum pulse rate was 66.0 bpm while the maximum pulse rate was 106.0 bpm.  Cardiac rhythm was normal.  Diagnosis:  Mild Obstructive Sleep Apnea  Recommendations: 1.  Clinical correlation of these findings is necessary.  The decision to treat obstructive sleep apnea (OSA) is usually based on the presence of apnea symptoms or the presence of associated medical conditions such as Hypertension, Congestive Heart Failure, Atrial Fibrillation or Obesity.  The most common symptoms of OSA are snoring, gasping for breath while sleeping, daytime sleepiness and fatigue.   2.  Initiating apnea therapy is recommended given the presence of symptoms and/or associated conditions. Recommend proceeding with one of the following:     a.  Auto-CPAP therapy with a pressure range of 5-20cm H2O.     b.  An oral appliance (OA) that can be obtained from certain dentists with expertise in sleep medicine.  These are primarily of use in non-obese patients with mild and moderate disease.     c.  An ENT consultation which may be useful to look for specific causes of obstruction and possible treatment options.     d.  If patient is intolerant to PAP therapy, consider referral to ENT for evaluation for hypoglossal nerve stimulator.   3.  Close follow-up is necessary to ensure success with CPAP or oral appliance therapy for maximum benefit.  4.  A follow-up oximetry study on CPAP  is recommended to assess the adequacy of therapy and determine the need for supplemental oxygen or the potential need for Bi-level therapy.  An arterial blood gas to determine the adequacy of baseline ventilation and oxygenation should also be considered.  5.  Healthy sleep recommendations include:  adequate nightly  sleep (normal 7-9 hrs/night), avoidance of caffeine after noon and alcohol near bedtime, and maintaining a sleep environment that is cool, dark and quiet.  6.  Weight loss for overweight patients is recommended.  Even modest amounts of weight loss can significantly improve the severity of sleep apnea.  7.  Snoring recommendations include:  weight loss where appropriate, side sleeping, and avoidance of alcohol before bed.  8.  Operation of motor vehicle should be avoided when sleepy.    This study was personally reviewed and electronically signed by: Dr. Wilbert Bihari Accredited Board Certified in Sleep Medicine Date/Time: 08/28/2023 6:58PM

## 2023-08-29 ENCOUNTER — Telehealth: Payer: Self-pay

## 2023-08-29 NOTE — Telephone Encounter (Signed)
 Pt returning call. He asked if you can call back and send him mychart message.

## 2023-08-29 NOTE — Telephone Encounter (Signed)
 Returned patient's call, notified patient of sleep study results and recommendations. Patient will get scheduled for OV to discuss treatment options.

## 2023-08-29 NOTE — Telephone Encounter (Signed)
-----   Message from Wilbert Bihari sent at 08/28/2023  7:00 PM EDT ----- Patient has very mild OSA - set up OV to discuss treatment options.

## 2023-08-29 NOTE — Telephone Encounter (Signed)
 Left callback number for patient to receive sleep study results and recommendations.

## 2023-09-01 ENCOUNTER — Other Ambulatory Visit (HOSPITAL_BASED_OUTPATIENT_CLINIC_OR_DEPARTMENT_OTHER): Payer: Self-pay

## 2023-09-05 ENCOUNTER — Other Ambulatory Visit (HOSPITAL_BASED_OUTPATIENT_CLINIC_OR_DEPARTMENT_OTHER): Payer: Self-pay

## 2023-09-05 ENCOUNTER — Other Ambulatory Visit: Payer: Self-pay

## 2023-09-06 ENCOUNTER — Ambulatory Visit: Attending: Cardiology | Admitting: Cardiology

## 2023-09-20 ENCOUNTER — Other Ambulatory Visit: Payer: Self-pay

## 2023-09-20 ENCOUNTER — Encounter (HOSPITAL_COMMUNITY): Payer: Self-pay

## 2023-09-20 ENCOUNTER — Other Ambulatory Visit (HOSPITAL_BASED_OUTPATIENT_CLINIC_OR_DEPARTMENT_OTHER): Payer: Self-pay

## 2023-09-20 ENCOUNTER — Emergency Department (HOSPITAL_COMMUNITY)
Admission: EM | Admit: 2023-09-20 | Discharge: 2023-09-20 | Disposition: A | Discharge status: Home / Self Care | Attending: Emergency Medicine | Admitting: Emergency Medicine

## 2023-09-20 DIAGNOSIS — R220 Localized swelling, mass and lump, head: Secondary | ICD-10-CM | POA: Diagnosis not present

## 2023-09-20 DIAGNOSIS — L03211 Cellulitis of face: Secondary | ICD-10-CM | POA: Diagnosis not present

## 2023-09-20 MED ORDER — DOXYCYCLINE HYCLATE 100 MG PO CAPS
100.0000 mg | ORAL_CAPSULE | Freq: Two times a day (BID) | ORAL | 0 refills | Status: AC
  Filled 2023-09-20: qty 14, 7d supply, fill #0

## 2023-09-20 MED ORDER — IBUPROFEN 400 MG PO TABS
600.0000 mg | ORAL_TABLET | Freq: Once | ORAL | Status: AC
  Administered 2023-09-20: 600 mg via ORAL
  Filled 2023-09-20: qty 1

## 2023-09-20 NOTE — Discharge Instructions (Signed)
 Apply warm compresses to the affected area at least 3 times per day.  Use ibuprofen  and Tylenol .  Start the antibiotics.  Follow-up with your primary care provider in the next 2-3 days to make sure it is improving.  However if it is worsening, you develop fever, trouble breathing or swallowing, or any other new/concerning symptoms then return to the ER.

## 2023-09-20 NOTE — ED Provider Notes (Signed)
 [place] EM[facility]GENCY DEPARTMENT AT [facility] Provider Note   CSN: [id number] Arrival date & time: 09/20/23  [id number]     Patient presents with: Facial Swelling   George Rollins is a 38 y.o. male.   HPI 38 year old male presents with facial swelling and concern for infection.  Patient states that he noticed a bump to his upper lip about 4 days ago and has noticed swelling over the last couple days.  No fevers or trouble breathing or trouble swallowing.  He does not feel like there is any oral swelling such as dental swelling or pain.  He does not think he is ever had MRSA before  Prior to Admission medications   Medication Sig Start Date End Date Taking? Authorizing Provider  doxycycline  (VIBRAMYCIN ) 100 MG capsule Take 1 capsule (100 mg total) by mouth 2 (two) times daily. One po bid x 7 days 09/20/23  Yes Freddi Hamilton, MD  albuterol  (VENTOLIN  HFA) 108 (90 Base) MCG/ACT inhaler Inhale 2 puffs into the lungs every 6 (six) hours as needed for wheezing or shortness of breath. 06/09/23   Lorren Greig PARAS, NP  Blood Pressure Monitor MISC Use as directed. 05/03/23   Newlin, Enobong, MD  fluticasone  (FLONASE ) 50 MCG/ACT nasal spray Place 2 sprays into both nostrils daily. 06/09/23   Lorren Greig PARAS, NP  valsartan  (DIOVAN ) 80 MG tablet Take 1 tablet (80 mg total) by mouth daily. 06/09/23 09/07/23  Lorren Greig PARAS, NP  metoprolol  tartrate (LOPRESSOR ) 25 MG tablet Take 1 tablet (25 mg total) by mouth 2 (two) times daily. Patient not taking: Reported on 03/30/2019 03/24/18 03/30/19  Stephania Ozell RAMAN, MD    Allergies: Fruit & vegetable daily [nutritional supplements]    Review of Systems  Constitutional:  Negative for fever.  HENT:  Positive for facial swelling. Negative for dental problem.   Respiratory:  Negative for shortness of breath.     Updated Vital Signs BP (!) 153/101 (BP Location: Right Arm)   Pulse 90   Temp 98.7 F (37.1 C)   Resp 15   Ht 5' 10 (1.778 m)   Wt  99.8 kg   SpO2 98%   BMI 31.57 kg/m   Physical Exam Vitals and nursing note reviewed.  Constitutional:      General: He is not in acute distress.    Appearance: He is well-developed. He is not ill-appearing or diaphoretic.  HENT:     Head: Normocephalic and atraumatic.     Mouth/Throat:      Comments: No oral swelling or obvious dental abscess or dental pain/tenderness Pulmonary:     Effort: Pulmonary effort is normal.  Skin:    General: Skin is warm and dry.  Neurological:     Mental Status: He is alert.     (all labs ordered are listed, but only abnormal results are displayed) Labs Reviewed - No data to display  EKG: None  Radiology: No results found.   Procedures   Medications Ordered in the ED  ibuprofen  (ADVIL ) tablet 600 mg (has no administration in time range)                                    Medical Decision Making Risk Prescription drug management.   Patient appears to have a skin infection.  I do not feel any fluctuance that would be amenable to an incision and drainage.  He has had abscesses  on his head/scalp before.  I think is reasonable after discussion with the patient to hold off on I&D as I do not think we would get much think is reasonable after discussion with the patient to hold off on I&D as I do not think we would get much and instead treat with warm compresses and oral antibiotics.  Will recommend he follow-up with PCP.  Take Tylenol  and Ibuprofen  for pain. Will give return precautions.  Highly doubt deep space infection or necrotizing infection.     Final diagnoses:  Facial cellulitis    ED Discharge Orders          Ordered    doxycycline  (VIBRAMYCIN ) 100 MG capsule  2 times daily        09/20/23 9066               Freddi Hamilton, MD 09/20/23 [phone]
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
## 2023-12-01 ENCOUNTER — Emergency Department (HOSPITAL_COMMUNITY)
Admission: EM | Admit: 2023-12-01 | Discharge: 2023-12-02 | Disposition: A | Discharge status: Home / Self Care | Payer: Self-pay | Attending: Emergency Medicine | Admitting: Emergency Medicine

## 2023-12-01 ENCOUNTER — Emergency Department (HOSPITAL_COMMUNITY): Payer: Self-pay

## 2023-12-01 ENCOUNTER — Other Ambulatory Visit: Payer: Self-pay

## 2023-12-01 DIAGNOSIS — F10129 Alcohol abuse with intoxication, unspecified: Secondary | ICD-10-CM | POA: Insufficient documentation

## 2023-12-01 DIAGNOSIS — E876 Hypokalemia: Secondary | ICD-10-CM | POA: Diagnosis not present

## 2023-12-01 DIAGNOSIS — R0789 Other chest pain: Secondary | ICD-10-CM | POA: Diagnosis not present

## 2023-12-01 DIAGNOSIS — S0083XA Contusion of other part of head, initial encounter: Secondary | ICD-10-CM | POA: Insufficient documentation

## 2023-12-01 DIAGNOSIS — Y92410 Unspecified street and highway as the place of occurrence of the external cause: Secondary | ICD-10-CM | POA: Diagnosis not present

## 2023-12-01 DIAGNOSIS — F10920 Alcohol use, unspecified with intoxication, uncomplicated: Secondary | ICD-10-CM

## 2023-12-01 DIAGNOSIS — Y906 Blood alcohol level of 120-199 mg/100 ml: Secondary | ICD-10-CM | POA: Diagnosis not present

## 2023-12-01 DIAGNOSIS — S0990XA Unspecified injury of head, initial encounter: Secondary | ICD-10-CM | POA: Diagnosis present
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
## 2023-12-01 NOTE — ED Notes (Signed)
 Patient transported to CT

## 2023-12-02 ENCOUNTER — Emergency Department (HOSPITAL_COMMUNITY): Payer: Self-pay

## 2023-12-02 ENCOUNTER — Encounter (HOSPITAL_COMMUNITY): Payer: Self-pay

## 2023-12-02 LAB — CBC WITH DIFFERENTIAL/PLATELET
Abs Immature Granulocytes: 0.01 K/uL (ref 0.00–0.07)
Basophils Absolute: 0 K/uL (ref 0.0–0.1)
Basophils Relative: 1 %
Eosinophils Absolute: 0.1 K/uL (ref 0.0–0.5)
Eosinophils Relative: 2 %
HCT: 44.8 % (ref 39.0–52.0)
Hemoglobin: 15.1 g/dL (ref 13.0–17.0)
Immature Granulocytes: 0 %
Lymphocytes Relative: 42 %
Lymphs Abs: 2.8 K/uL (ref 0.7–4.0)
MCH: 30.2 pg (ref 26.0–34.0)
MCHC: 33.7 g/dL (ref 30.0–36.0)
MCV: 89.6 fL (ref 80.0–100.0)
Monocytes Absolute: 0.5 K/uL (ref 0.1–1.0)
Monocytes Relative: 7 %
Neutro Abs: 3.3 K/uL (ref 1.7–7.7)
Neutrophils Relative %: 48 %
Platelets: 262 K/uL (ref 150–400)
RBC: 5 MIL/uL (ref 4.22–5.81)
RDW: 13.9 % (ref 11.5–15.5)
WBC: 6.7 K/uL (ref 4.0–10.5)
nRBC: 0 % (ref 0.0–0.2)

## 2023-12-02 LAB — I-STAT CHEM 8, ED
BUN: 7 mg/dL (ref 6–20)
Calcium, Ion: 1.09 mmol/L — ABNORMAL LOW (ref 1.15–1.40)
Chloride: 103 mmol/L (ref 98–111)
Creatinine, Ser: 0.9 mg/dL (ref 0.61–1.24)
Glucose, Bld: 107 mg/dL — ABNORMAL HIGH (ref 70–99)
HCT: 45 % (ref 39.0–52.0)
Hemoglobin: 15.3 g/dL (ref 13.0–17.0)
Potassium: 3.3 mmol/L — ABNORMAL LOW (ref 3.5–5.1)
Sodium: 142 mmol/L (ref 135–145)
TCO2: 28 mmol/L (ref 22–32)

## 2023-12-02 LAB — ETHANOL: Alcohol, Ethyl (B): 121 mg/dL — ABNORMAL HIGH (ref <15)

## 2023-12-02 MED ORDER — IOHEXOL 350 MG/ML SOLN
75.0000 mL | Freq: Once | INTRAVENOUS | Status: AC | PRN
  Administered 2023-12-02: 75 mL via INTRAVENOUS

## 2023-12-02 MED ORDER — KETOROLAC TROMETHAMINE 15 MG/ML IJ SOLN
15.0000 mg | Freq: Once | INTRAMUSCULAR | Status: AC
  Administered 2023-12-02: 15 mg via INTRAVENOUS
  Filled 2023-12-02: qty 1
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
## 2023-12-02 NOTE — Discharge Instructions (Signed)
 As we discussed, all your labs and imaging are normal.  You can follow-up with your primary care doctor for further evaluation.  You may return to the emergency department for any worsening symptoms.  I would start taking 600 mg of ibuprofen  every 6 hours as needed for pain. Can stack tylenol  at the 3-4 hour mark.

## 2023-12-20 ENCOUNTER — Emergency Department (HOSPITAL_COMMUNITY)
Admission: EM | Admit: 2023-12-20 | Discharge: 2023-12-21 | Disposition: A | Discharge status: Home / Self Care | Payer: Self-pay | Attending: Emergency Medicine | Admitting: Emergency Medicine

## 2023-12-20 ENCOUNTER — Encounter (HOSPITAL_COMMUNITY): Payer: Self-pay | Admitting: Emergency Medicine

## 2023-12-20 DIAGNOSIS — J45909 Unspecified asthma, uncomplicated: Secondary | ICD-10-CM | POA: Insufficient documentation

## 2023-12-20 DIAGNOSIS — Z79899 Other long term (current) drug therapy: Secondary | ICD-10-CM | POA: Insufficient documentation

## 2023-12-20 DIAGNOSIS — I10 Essential (primary) hypertension: Secondary | ICD-10-CM | POA: Insufficient documentation

## 2023-12-20 DIAGNOSIS — D72829 Elevated white blood cell count, unspecified: Secondary | ICD-10-CM | POA: Insufficient documentation

## 2023-12-20 DIAGNOSIS — R112 Nausea with vomiting, unspecified: Secondary | ICD-10-CM | POA: Insufficient documentation

## 2023-12-20 DIAGNOSIS — Y904 Blood alcohol level of 80-99 mg/100 ml: Secondary | ICD-10-CM | POA: Insufficient documentation

## 2023-12-20 NOTE — ED Triage Notes (Signed)
 Vomiting x few hours ago. Pt drank ETOH tonight. Pt was home at the time. Tacy 118. Zofran  given IV by EMS

## 2023-12-21 LAB — COMPREHENSIVE METABOLIC PANEL WITH GFR
ALT: 39 U/L (ref 0–44)
AST: 41 U/L (ref 15–41)
Albumin: 4.1 g/dL (ref 3.5–5.0)
Alkaline Phosphatase: 86 U/L (ref 38–126)
Anion gap: 25 — ABNORMAL HIGH (ref 5–15)
BUN: 15 mg/dL (ref 6–20)
CO2: 14 mmol/L — ABNORMAL LOW (ref 22–32)
Calcium: 8.8 mg/dL — ABNORMAL LOW (ref 8.9–10.3)
Chloride: 102 mmol/L (ref 98–111)
Creatinine, Ser: 0.84 mg/dL (ref 0.61–1.24)
GFR, Estimated: >60 mL/min (ref >60)
Glucose, Bld: 49 mg/dL — ABNORMAL LOW (ref 70–99)
Potassium: 4.4 mmol/L (ref 3.5–5.1)
Sodium: 141 mmol/L (ref 135–145)
Total Bilirubin: 0.3 mg/dL (ref 0.0–1.2)
Total Protein: 6.6 g/dL (ref 6.5–8.1)

## 2023-12-21 LAB — CBC WITH DIFFERENTIAL/PLATELET
Abs Immature Granulocytes: 0.07 K/uL (ref 0.00–0.07)
Basophils Absolute: 0 K/uL (ref 0.0–0.1)
Basophils Relative: 0 %
Eosinophils Absolute: 0 K/uL (ref 0.0–0.5)
Eosinophils Relative: 0 %
HCT: 47.5 % (ref 39.0–52.0)
Hemoglobin: 14.9 g/dL (ref 13.0–17.0)
Immature Granulocytes: 1 %
Lymphocytes Relative: 13 %
Lymphs Abs: 2 K/uL (ref 0.7–4.0)
MCH: 29.4 pg (ref 26.0–34.0)
MCHC: 31.4 g/dL (ref 30.0–36.0)
MCV: 93.7 fL (ref 80.0–100.0)
Monocytes Absolute: 0.4 K/uL (ref 0.1–1.0)
Monocytes Relative: 2 %
Neutro Abs: 12.3 K/uL — ABNORMAL HIGH (ref 1.7–7.7)
Neutrophils Relative %: 84 %
Platelets: 324 K/uL (ref 150–400)
RBC: 5.07 MIL/uL (ref 4.22–5.81)
RDW: 14.6 % (ref 11.5–15.5)
WBC: 14.7 K/uL — ABNORMAL HIGH (ref 4.0–10.5)
nRBC: 0 % (ref 0.0–0.2)

## 2023-12-21 LAB — I-STAT CHEM 8, ED
BUN: 13 mg/dL (ref 6–20)
Calcium, Ion: 1.04 mmol/L — ABNORMAL LOW (ref 1.15–1.40)
Chloride: 109 mmol/L (ref 98–111)
Creatinine, Ser: 0.8 mg/dL (ref 0.61–1.24)
Glucose, Bld: 67 mg/dL — ABNORMAL LOW (ref 70–99)
HCT: 42 % (ref 39.0–52.0)
Hemoglobin: 14.3 g/dL (ref 13.0–17.0)
Potassium: 4.1 mmol/L (ref 3.5–5.1)
Sodium: 142 mmol/L (ref 135–145)
TCO2: 18 mmol/L — ABNORMAL LOW (ref 22–32)

## 2023-12-21 LAB — TROPONIN T, HIGH SENSITIVITY: Troponin T High Sensitivity: <15 ng/L (ref 0–19)

## 2023-12-21 LAB — LIPASE, BLOOD: Lipase: 46 U/L (ref 11–51)

## 2023-12-21 LAB — ETHANOL: Alcohol, Ethyl (B): 91 mg/dL — ABNORMAL HIGH (ref <15)

## 2023-12-21 MED ORDER — ONDANSETRON 4 MG PO TBDP
4.0000 mg | ORAL_TABLET | Freq: Three times a day (TID) | ORAL | 0 refills | Status: AC | PRN
Start: 2023-12-21 — End: ?

## 2023-12-21 MED ORDER — SODIUM CHLORIDE 0.9 % IV BOLUS
1000.0000 mL | Freq: Once | INTRAVENOUS | Status: AC
Start: 2023-12-21 — End: 2023-12-21
  Administered 2023-12-21: 1000 mL via INTRAVENOUS

## 2023-12-21 MED ORDER — SODIUM CHLORIDE 0.9 % IV BOLUS
1000.0000 mL | Freq: Once | INTRAVENOUS | Status: AC
  Administered 2023-12-21: 1000 mL via INTRAVENOUS

## 2023-12-21 MED ORDER — ONDANSETRON HCL 4 MG/2ML IJ SOLN: 4.0000 mg | Freq: Once | INTRAMUSCULAR | Status: DC

## 2023-12-21 NOTE — ED Notes (Signed)
 Pt asking for something to drink. Ice chips given since he is still endorsing nausea

## 2023-12-21 NOTE — Discharge Instructions (Signed)
 You did have some minor electrolyte abnormalities on your initial chemistry, after fluids this was repeated and is now within normal limits. Your cardiac testing today was normal and heart rate has improved. Can continue Zofran  as needed if vomiting persists. Commend you follow-up closely with your primary care doctor. Return here for new concerns.

## 2023-12-21 NOTE — ED Provider Notes (Signed)
 [place] EMERGENCY DEPARTMENT AT [facility] Provider Note   CSN: [id number] Arrival date & time: 12/20/23  2330     Patient presents with: Emesis   George Rollins is a 38 y.o. male.   The history is provided by the patient and medical records.  Emesis  38 y.o. M with hx of alcohol abuse, HTN, asthma, presenting to the ED for concern about his HR.  States he did eat a turkey sausage today that tasted a little weird, afterwards had a few episodes of nonbloody, nonbilious emesis.  States he broke out into a sweat and felt like his heart was racing so became concerned about this.  He denies any significant chest pain or shortness of breath but expresses concern about his heart function.  Did have alcohol today, a few drinks reported.  He denies any significant abdominal pain at this time.  He is requesting to drink ginger ale.  Prior to Admission medications   Medication Sig Start Date End Date Taking? Authorizing Provider  albuterol  (VENTOLIN  HFA) 108 (90 Base) MCG/ACT inhaler Inhale 2 puffs into the lungs every 6 (six) hours as needed for wheezing or shortness of breath. 06/09/23   Lorren Greig PARAS, NP  Blood Pressure Monitor MISC Use as directed. 05/03/23   Newlin, Enobong, MD  fluticasone  (FLONASE ) 50 MCG/ACT nasal spray Place 2 sprays into both nostrils daily. 06/09/23   Lorren Greig PARAS, NP  valsartan  (DIOVAN ) 80 MG tablet Take 1 tablet (80 mg total) by mouth daily. 06/09/23 09/07/23  Lorren Greig PARAS, NP  metoprolol  tartrate (LOPRESSOR ) 25 MG tablet Take 1 tablet (25 mg total) by mouth 2 (two) times daily. Patient not taking: Reported on 03/30/2019 03/24/18 03/30/19  Stephania Ozell RAMAN, MD    Allergies: Fruit & vegetable daily [nutritional supplements]    Review of Systems  Cardiovascular:  Positive for palpitations.  Gastrointestinal:  Positive for nausea and vomiting.  All other systems reviewed and are negative.   Updated Vital Signs BP (!) 147/88 (BP Location:  Left Arm)   Pulse (!) 119   Temp 98.5 F (36.9 C) (Oral)   Resp 18   SpO2 94%   Physical Exam Vitals and nursing note reviewed.  Constitutional:      Appearance: He is well-developed.  HENT:     Head: Normocephalic and atraumatic.  Eyes:     Conjunctiva/sclera: Conjunctivae normal.     Pupils: Pupils are equal, round, and reactive to light.  Cardiovascular:     Rate and Rhythm: Regular rhythm. Tachycardia present.     Heart sounds: Normal heart sounds.     Comments: Mild tachycardia around 110 during exam Pulmonary:     Effort: Pulmonary effort is normal.     Breath sounds: Normal breath sounds.  Abdominal:     General: Bowel sounds are normal.     Palpations: Abdomen is soft.     Tenderness: There is no abdominal tenderness. There is no guarding or rebound.     Comments: Soft, non-tender, po peritoneal signs  Musculoskeletal:        General: Normal range of motion.     Cervical back: Normal range of motion.  Skin:    General: Skin is warm and dry.  Neurological:     Mental Status: He is alert and oriented to person, place, and time.     (all labs ordered are listed, but only abnormal results are displayed) Labs Reviewed  ETHANOL - Abnormal; Notable for the following components:  Result Value   Alcohol, Ethyl (B) 91 (*)    All other components within normal limits  COMPREHENSIVE METABOLIC PANEL WITH GFR - Abnormal; Notable for the following components:   CO2 14 (*)    Glucose, Bld 49 (*)    Calcium 8.8 (*)    Anion gap 25 (*)    All other components within normal limits  CBC WITH DIFFERENTIAL/PLATELET - Abnormal; Notable for the following components:   WBC 14.7 (*)    Neutro Abs 12.3 (*)    All other components within normal limits  I-STAT CHEM 8, ED - Abnormal; Notable for the following components:   Glucose, Bld 67 (*)    Calcium, Ion 1.04 (*)    TCO2 18 (*)    All other components within normal limits  LIPASE, BLOOD  TROPONIN T, HIGH SENSITIVITY     EKG: EKG Interpretation Date/Time:  Tuesday December 20 2023 23:43:12 EDT Ventricular Rate:  118 PR Interval:  131 QRS Duration:  99 QT Interval:  353 QTC Calculation: 495 R Axis:   112  Text Interpretation: Sinus tachycardia Right axis deviation Borderline prolonged QT interval When compared with ECG of 07/04/2012, HEART RATE has increased Confirmed by Raford Lenis ([id number]) on 12/20/2023 11:51:10 PM  Radiology: No results found.   Procedures   Medications Ordered in the ED  sodium chloride  0.9 % bolus 1,000 mL (1,000 mLs Intravenous New Bag/Given 12/21/23 0127)                                    Medical Decision Making Amount and/or Complexity of Data Reviewed Labs: ordered. ECG/medicine tests: ordered and independent interpretation performed.  Risk Prescription drug management.   38 year old male presenting to the ED with concern of tachycardia.  Patient turkey sausage today that tasted irregular, had some vomiting.  Afterwards noted to have some sweating and rapid heart rate.  Was around 120 on arrival but has slowed by time of my evaluation.  Afebrile and nontoxic in appearance here.  He is eating ice chips and is in no acute distress.  Abdomen is soft and nontender, no peritoneal signs.  Labs are pending currently.  EKG reviewed, sinus tachycardia without acute ischemic changes.  I suspect this may be from mild dehydration and GI losses.  Will plan to give IV fluids.  He is concerned about his heart, will add on troponin.  Labs as above--does have a mild leukocytosis which I suspect is likely reactive from vomiting.  Chemistry with low bicarb of 14 and low glucose, however patient is eating and drinking currently.  Will recheck.  Gap is 25, suspect may be combination from GI losses and alcohol abuse (longstanding hx of same) and ethanol today is 91.  Troponin negative.  On re-check, HR still aroun 108-100.  He states he feels like symptoms are calming down but did  vomit once more.  Will give additional IVF, zofran .  Will repeat chemistry once completed to ensure gap closing.  Repeat chemistry with much improved values, gap has closed and is now 15.   He has been tolerating PO without further emesis.  Feel he is stable for discharge.  Will have him continue good oral hydration at home.  Can follow-up with PCP.  Return here for new concerns.  Final diagnoses:  Nausea and vomiting, unspecified vomiting type    ED Discharge Orders  Ordered    ondansetron  (ZOFRAN -ODT) 4 MG disintegrating tablet  Every 8 hours PRN        12/21/23 0448               Jarold Olam HERO, PA-C 12/21/23 [id number]    Raford Lenis, MD 12/21/23 [phone]
